# Patient Record
Sex: Male | Born: 1970 | Race: White | Hispanic: No | Marital: Single | Smoking: Current every day smoker
Health system: Southern US, Community
[De-identification: ages and names within clinical notes are randomized; demographics above are authoritative.]

## PROBLEM LIST (undated history)

## (undated) DIAGNOSIS — F329 Major depressive disorder, single episode, unspecified: Secondary | ICD-10-CM

## (undated) DIAGNOSIS — F32A Depression, unspecified: Secondary | ICD-10-CM

## (undated) DIAGNOSIS — K7460 Unspecified cirrhosis of liver: Secondary | ICD-10-CM

---

## 1998-12-17 ENCOUNTER — Inpatient Hospital Stay (HOSPITAL_COMMUNITY): Admission: EM | Admit: 1998-12-17 | Discharge: 1998-12-21 | Payer: Self-pay | Admitting: Emergency Medicine

## 2002-02-04 ENCOUNTER — Emergency Department (HOSPITAL_COMMUNITY): Admission: EM | Admit: 2002-02-04 | Discharge: 2002-02-04 | Payer: Self-pay | Admitting: Emergency Medicine

## 2002-02-05 ENCOUNTER — Inpatient Hospital Stay (HOSPITAL_COMMUNITY): Admission: EM | Admit: 2002-02-05 | Discharge: 2002-02-10 | Payer: Self-pay | Admitting: *Deleted

## 2002-02-12 ENCOUNTER — Other Ambulatory Visit (HOSPITAL_COMMUNITY): Admission: RE | Admit: 2002-02-12 | Discharge: 2002-03-13 | Payer: Self-pay | Admitting: *Deleted

## 2006-04-08 ENCOUNTER — Emergency Department (HOSPITAL_COMMUNITY): Admission: EM | Admit: 2006-04-08 | Discharge: 2006-04-09 | Payer: Self-pay | Admitting: Emergency Medicine

## 2009-04-20 ENCOUNTER — Encounter (INDEPENDENT_AMBULATORY_CARE_PROVIDER_SITE_OTHER): Payer: Self-pay | Admitting: *Deleted

## 2009-04-20 ENCOUNTER — Inpatient Hospital Stay (HOSPITAL_COMMUNITY): Admission: EM | Admit: 2009-04-20 | Discharge: 2009-04-27 | Payer: Self-pay | Admitting: Emergency Medicine

## 2009-04-20 ENCOUNTER — Encounter (INDEPENDENT_AMBULATORY_CARE_PROVIDER_SITE_OTHER): Payer: Self-pay | Admitting: Internal Medicine

## 2009-04-23 ENCOUNTER — Ambulatory Visit: Payer: Self-pay | Admitting: Gastroenterology

## 2009-04-25 ENCOUNTER — Encounter: Payer: Self-pay | Admitting: Internal Medicine

## 2009-04-26 ENCOUNTER — Encounter (INDEPENDENT_AMBULATORY_CARE_PROVIDER_SITE_OTHER): Payer: Self-pay | Admitting: *Deleted

## 2009-04-28 ENCOUNTER — Telehealth (INDEPENDENT_AMBULATORY_CARE_PROVIDER_SITE_OTHER): Payer: Self-pay

## 2009-05-04 ENCOUNTER — Telehealth: Payer: Self-pay | Admitting: Gastroenterology

## 2009-05-04 ENCOUNTER — Encounter: Payer: Self-pay | Admitting: Physician Assistant

## 2009-05-04 ENCOUNTER — Ambulatory Visit: Payer: Self-pay | Admitting: Internal Medicine

## 2009-05-04 DIAGNOSIS — I8500 Esophageal varices without bleeding: Secondary | ICD-10-CM | POA: Insufficient documentation

## 2009-05-04 DIAGNOSIS — F411 Generalized anxiety disorder: Secondary | ICD-10-CM | POA: Insufficient documentation

## 2009-05-04 DIAGNOSIS — F329 Major depressive disorder, single episode, unspecified: Secondary | ICD-10-CM

## 2009-05-04 DIAGNOSIS — D649 Anemia, unspecified: Secondary | ICD-10-CM | POA: Insufficient documentation

## 2009-05-04 DIAGNOSIS — D696 Thrombocytopenia, unspecified: Secondary | ICD-10-CM

## 2009-05-04 DIAGNOSIS — F1020 Alcohol dependence, uncomplicated: Secondary | ICD-10-CM | POA: Insufficient documentation

## 2009-05-04 DIAGNOSIS — K7460 Unspecified cirrhosis of liver: Secondary | ICD-10-CM | POA: Insufficient documentation

## 2009-05-05 ENCOUNTER — Telehealth: Payer: Self-pay | Admitting: Physician Assistant

## 2009-05-05 LAB — CONVERTED CEMR LAB
ALT: 31 units/L (ref 0–53)
AST: 58 units/L — ABNORMAL HIGH (ref 0–37)
Albumin: 2.7 g/dL — ABNORMAL LOW (ref 3.5–5.2)
Alkaline Phosphatase: 109 units/L (ref 39–117)
Hemoglobin: 8.8 g/dL — ABNORMAL LOW (ref 13.0–17.0)
Lymphs Abs: 1.6 10*3/uL (ref 0.7–4.0)
MCV: 84.5 fL (ref 78.0–100.0)
Monocytes Absolute: 0.6 10*3/uL (ref 0.1–1.0)
Monocytes Relative: 16 % — ABNORMAL HIGH (ref 3–12)
Neutro Abs: 1.2 10*3/uL — ABNORMAL LOW (ref 1.7–7.7)
Neutrophils Relative %: 33 % — ABNORMAL LOW (ref 43–77)
Potassium: 3.9 meq/L (ref 3.5–5.3)
Prothrombin Time: 22.5 s — ABNORMAL HIGH (ref 11.6–15.2)
RBC: 3.42 M/uL — ABNORMAL LOW (ref 4.22–5.81)
Sodium: 135 meq/L (ref 135–145)
Total Protein: 7 g/dL (ref 6.0–8.3)
WBC: 3.7 10*3/uL — ABNORMAL LOW (ref 4.0–10.5)

## 2009-05-19 ENCOUNTER — Ambulatory Visit: Payer: Self-pay | Admitting: Gastroenterology

## 2009-05-19 DIAGNOSIS — K7030 Alcoholic cirrhosis of liver without ascites: Secondary | ICD-10-CM

## 2009-05-19 DIAGNOSIS — R188 Other ascites: Secondary | ICD-10-CM

## 2009-07-04 ENCOUNTER — Ambulatory Visit: Payer: Self-pay | Admitting: Gastroenterology

## 2009-07-04 LAB — CONVERTED CEMR LAB
Alkaline Phosphatase: 142 units/L — ABNORMAL HIGH (ref 39–117)
Basophils Absolute: 0 10*3/uL (ref 0.0–0.1)
Basophils Relative: 1.2 % (ref 0.0–3.0)
CO2: 30 meq/L (ref 19–32)
Creatinine, Ser: 0.8 mg/dL (ref 0.4–1.5)
Eosinophils Relative: 6.3 % — ABNORMAL HIGH (ref 0.0–5.0)
GFR calc non Af Amer: 115.52 mL/min (ref >60)
Glucose, Bld: 78 mg/dL (ref 70–99)
Hemoglobin: 9.8 g/dL — ABNORMAL LOW (ref 13.0–17.0)
INR: 1.7 — ABNORMAL HIGH (ref 0.8–1.0)
Lymphocytes Relative: 38 % (ref 12.0–46.0)
Monocytes Relative: 9.3 % (ref 3.0–12.0)
Neutro Abs: 1.8 10*3/uL (ref 1.4–7.7)
Neutrophils Relative %: 45.2 % (ref 43.0–77.0)
Prothrombin Time: 17.5 s — ABNORMAL HIGH (ref 9.1–11.7)
RBC: 3.51 M/uL — ABNORMAL LOW (ref 4.22–5.81)
Sodium: 142 meq/L (ref 135–145)
Total Bilirubin: 2.7 mg/dL — ABNORMAL HIGH (ref 0.3–1.2)
WBC: 3.9 10*3/uL — ABNORMAL LOW (ref 4.5–10.5)

## 2009-07-27 ENCOUNTER — Telehealth (INDEPENDENT_AMBULATORY_CARE_PROVIDER_SITE_OTHER): Payer: Self-pay | Admitting: *Deleted

## 2009-08-23 ENCOUNTER — Ambulatory Visit: Payer: Self-pay | Admitting: Gastroenterology

## 2009-08-27 ENCOUNTER — Encounter: Payer: Self-pay | Admitting: Physician Assistant

## 2009-08-28 ENCOUNTER — Telehealth (INDEPENDENT_AMBULATORY_CARE_PROVIDER_SITE_OTHER): Payer: Self-pay | Admitting: *Deleted

## 2009-10-04 ENCOUNTER — Ambulatory Visit: Payer: Self-pay | Admitting: Physician Assistant

## 2009-10-04 DIAGNOSIS — N62 Hypertrophy of breast: Secondary | ICD-10-CM

## 2009-10-04 DIAGNOSIS — K649 Unspecified hemorrhoids: Secondary | ICD-10-CM | POA: Insufficient documentation

## 2009-10-05 LAB — CONVERTED CEMR LAB
ALT: 30 units/L (ref 0–53)
AST: 47 units/L — ABNORMAL HIGH (ref 0–37)
Albumin: 3.4 g/dL — ABNORMAL LOW (ref 3.5–5.2)
Basophils Absolute: 0 10*3/uL (ref 0.0–0.1)
Basophils Relative: 1 % (ref 0–1)
Calcium: 8.3 mg/dL — ABNORMAL LOW (ref 8.4–10.5)
Chloride: 107 meq/L (ref 96–112)
Eosinophils Absolute: 0.2 10*3/uL (ref 0.0–0.7)
Eosinophils Relative: 4 % (ref 0–5)
Lymphocytes Relative: 50 % — ABNORMAL HIGH (ref 12–46)
Lymphs Abs: 2.2 10*3/uL (ref 0.7–4.0)
MCHC: 32.2 g/dL (ref 30.0–36.0)
MCV: 85.3 fL (ref 78.0–100.0)
Monocytes Absolute: 0.5 10*3/uL (ref 0.1–1.0)
Monocytes Relative: 11 % (ref 3–12)
Neutrophils Relative %: 34 % — ABNORMAL LOW (ref 43–77)
Platelets: 91 10*3/uL — ABNORMAL LOW (ref 150–400)
Potassium: 4.1 meq/L (ref 3.5–5.3)
Sodium: 139 meq/L (ref 135–145)
Total Protein: 6.3 g/dL (ref 6.0–8.3)
WBC: 4.5 10*3/uL (ref 4.0–10.5)

## 2009-10-06 ENCOUNTER — Encounter: Payer: Self-pay | Admitting: Physician Assistant

## 2009-10-11 ENCOUNTER — Encounter: Payer: Self-pay | Admitting: Physician Assistant

## 2009-10-11 ENCOUNTER — Ambulatory Visit: Payer: Self-pay | Admitting: Family Medicine

## 2009-10-17 ENCOUNTER — Ambulatory Visit: Payer: Self-pay | Admitting: Physician Assistant

## 2009-10-17 ENCOUNTER — Telehealth: Payer: Self-pay | Admitting: Physician Assistant

## 2009-10-18 DIAGNOSIS — L538 Other specified erythematous conditions: Secondary | ICD-10-CM | POA: Insufficient documentation

## 2009-11-07 ENCOUNTER — Ambulatory Visit: Payer: Self-pay | Admitting: Physician Assistant

## 2009-11-08 ENCOUNTER — Ambulatory Visit: Payer: Self-pay | Admitting: Physician Assistant

## 2009-11-11 ENCOUNTER — Encounter: Payer: Self-pay | Admitting: Physician Assistant

## 2009-11-14 LAB — CONVERTED CEMR LAB
Basophils Relative: 1 % (ref 0–1)
Eosinophils Relative: 4 % (ref 0–5)
HCT: 35.1 % — ABNORMAL LOW (ref 39.0–52.0)
Hemoglobin: 11.2 g/dL — ABNORMAL LOW (ref 13.0–17.0)
MCHC: 31.9 g/dL (ref 30.0–36.0)
Monocytes Absolute: 0.5 10*3/uL (ref 0.1–1.0)
Monocytes Relative: 10 % (ref 3–12)
Neutro Abs: 1.7 10*3/uL (ref 1.7–7.7)
RBC: 4.12 M/uL — ABNORMAL LOW (ref 4.22–5.81)
RDW: 19.1 % — ABNORMAL HIGH (ref 11.5–15.5)

## 2009-11-17 ENCOUNTER — Ambulatory Visit: Payer: Self-pay | Admitting: Physician Assistant

## 2009-11-23 ENCOUNTER — Ambulatory Visit: Payer: Self-pay | Admitting: Gastroenterology

## 2009-11-23 DIAGNOSIS — K6289 Other specified diseases of anus and rectum: Secondary | ICD-10-CM

## 2009-11-23 DIAGNOSIS — K921 Melena: Secondary | ICD-10-CM

## 2010-03-02 ENCOUNTER — Ambulatory Visit: Payer: Self-pay | Admitting: Internal Medicine

## 2010-03-02 ENCOUNTER — Encounter (INDEPENDENT_AMBULATORY_CARE_PROVIDER_SITE_OTHER): Payer: Self-pay | Admitting: Internal Medicine

## 2010-03-02 LAB — CONVERTED CEMR LAB
Chloride: 107 meq/L (ref 96–112)
Sodium: 141 meq/L (ref 135–145)

## 2010-03-06 ENCOUNTER — Encounter (INDEPENDENT_AMBULATORY_CARE_PROVIDER_SITE_OTHER): Payer: Self-pay | Admitting: Internal Medicine

## 2010-03-07 ENCOUNTER — Encounter: Payer: Self-pay | Admitting: Gastroenterology

## 2010-04-23 LAB — CONVERTED CEMR LAB: Iron: 57 ug/dL (ref 42–165)

## 2010-04-25 NOTE — Assessment & Plan Note (Signed)
Summary: XFU-ASCITIES//DS   Vital Signs:  Patient profile:   40 year old male Height:      73 inches Weight:      201 pounds BMI:     26.61 Temp:     98.0 degrees F oral Pulse rate:   92 / minute Pulse rhythm:   regular Resp:     18 per minute BP sitting:   104 / 66  (left arm)  Vitals Entered By: Armenia Shannon (May 04, 2009 9:18 AM) CC: xf/u.... Is Patient Diabetic? No Pain Assessment Patient in pain? no       Does patient need assistance? Functional Status Self care Ambulation Normal   CC:  xf/u.....  History of Present Illness: 40 year old male presents as a new patient.  He is here for posthospitalization followup.  He has a history of alcoholism.  He was just admitted to [facility] with newly diagnosed alcoholic hepatic cirrhosis.  He developed ascites.  He underwent paracentesis removing 6.2 L of fluid.  He also underwent endoscopy that demonstrated grade 2 esophageal varices.  He was noted to have significant anemia that required transfusion with packed blood cells x2.  It was not felt that he was bleeding from his varices.  He was also noted have coagulopathy with elevated INR as well as thrombocytopenia.  He was placed on lactulose from mild encephalopathy.  He left the hospital at 214.5 pounds.  He weighs 201 pounds today.  He notes he continues to lose weight.  He is feeling better.  He denies vomiting.  He is having up to 3 stools per day.  He is thinking clearly.  He has not had any memory problems.  He is depressed.  He does not feel like doing much.  However, he does not really describe any significant lethargy.  He is taking his medications.  He sees the gastroenterologist and a couple of weeks.  Of note, with his depression, he has taken several other medications in the past.  He feels that he is depressed and he should be back on medication at this point in time.  He denies any suicidal ideations.  His PHQ9 score today is 12.  Habits &  Providers  Alcohol-Tobacco-Diet     Alcohol drinks/day: recovering alcoholic     Tobacco Status: current     Cigarette Packs/Day: <0.25     Pack years: 20  Exercise-Depression-Behavior     STD Risk: never     Drug Use: yes  Current Medications (verified): 1)  Furosemide 40 Mg Tabs (Furosemide) .... One Tab By Mouth Once Daily 2)  Potassium Chloride 20 Meq Pack (Potassium Chloride) .... One Tab By Mouth Daily While On Lasix 3)  Propranolol Hcl 10 Mg Tabs (Propranolol Hcl) .... One Tab By Mouth Three Times A Day 4)  Spironolactone 50 Mg Tabs (Spironolactone) .... One Tab By Mouth Daily 5)  Thiamine Hcl 100 Mg Tabs (Thiamine Hcl) .... One Tab Daily 6)  Cavan-Folate Ob 65-1 Mg Tabs (Prenatal Vit-Fe Fumarate-Fa) .... One Tab By Mouth Daily 7)  Kristalose 20 Gm Pack (Lactulose) .... One Tab By Mouth Daily 8)  Diphenhydramine Hcl 25 Mg Caps (Diphenhydramine Hcl) .... One Tab By Mouth Daily As Needed For Swelling 9)  Klonopin 1 Mg Tabs (Clonazepam) .... One Tab By Mouth Bi-Weekly As Needed 10)  Protonix 40 Mg Tbec (Pantoprazole Sodium) .... One Tab By Mouth Daily 11)  Percocet 5-325 Mg Tabs (Oxycodone-Acetaminophen) .... One Tab By Mouth Bi-Weekly Prn  Allergies (verified): No Known Drug Allergies  Past History:  Past Medical History: Cirrhosis   a.  alcoholic cirrhosis    b.  admx with ascites 03/2009 (s/p paracentesis with 6.2 L removed)   c.  Grade 2 esophageal varices Anemia-NOS   a.  s/p 2 units PRBCs during 03/2009 admxn   b.  no variceal bleeding detected Coagulopathy 2/2 liver cirrhosis h/o mild hepatic encephalopathy Thrombocytopenia Alcoholism Anxiety Depression (has taken zoloft, paxil, effexor in the past) ?h/o heart murmur  Past Surgical History: Denies surgical history  Family History: DM - Grandfather  Social History: Alcoholic   a.  previously drinking on daily basis (>1/5 liquor per day)   b.  denies drinking now (no ETOH since 04/19/2009) Drug use-yes  (h/o THC, cocaine, "everything in the book")   a.  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         no orthopnea or PND, no cough  Physical Exam  General:  alert, well-developed, and well-nourished.   Head:  normocephalic and atraumatic.   Eyes:  nonicteric Mouth:  pharynx pink and moist.   Neck:  supple, no thyromegaly, and no cervical lymphadenopathy.   Lungs:  normal breath sounds, no crackles, and no wheezes.   Heart:  normal rate, regular rhythm, and no murmur.   Abdomen:  distended soft nontender no hepatomegaly noted  Neurologic:  alert & oriented X3 and cranial nerves II-XII intact.   Psych:  Oriented X3, memory intact for recent and remote, and normally interactive.     Impression & Recommendations:  Problem # 1:  PREVENTIVE HEALTH CARE (ICD-V70.0) flu and pneumovax given in hosp check tsh as pt notes cold intol  Orders: T-TSH ([id number]) T-Drug Screen-Urine, (single) (04540-98119) T-HIV Antibody  (Reflex) ([id number]) T-Syphilis Test (RPR) (30865-78469)  Problem # 2:  DEPRESSION (ICD-311)  will d/w GI to see what meds we can use refer to LCSW close f/u with me  His updated medication list for this problem includes:    Klonopin 1 Mg Tabs (Clonazepam) ..... One tab by mouth bi-weekly as needed  Orders: Social Work Referral (Social )  Problem # 3:  ALCOHOLISM (ICD-303.90) patient states sober refer to sub abuse counselor  Orders: Social Work Referral (Social )  Problem # 4:  CIRRHOSIS (ICD-571.5) ascites seems to be improved weight is going down mentation seems stable having up to 3 stools per day with  lactulose no overt lethargy f/u with GI as scheduled   His updated medication list for this problem includes:    Furosemide 40 Mg Tabs (Furosemide) ..... One tab by mouth once daily    Propranolol Hcl 10 Mg Tabs (Propranolol hcl) ..... One tab by mouth three times a day    Spironolactone 50 Mg Tabs (Spironolactone) ..... One tab by mouth daily    Kristalose 20 Gm Pack (Lactulose) ..... One tab by mouth daily  Orders: T-Comprehensive Metabolic Panel [phone]) T-CBC w/Diff ([id number]) T-PT (Prothrombin Time) (66440)  Problem # 5:  ANEMIA-NOS (ICD-285.9) f/u on labs today  Orders: T-CBC w/Diff ([id number])  Problem # 6:  THROMBOCYTOPENIA (ICD-287.5) f/u on labs today  Orders: T-PT (Prothrombin Time) (87564)  Complete Medication List: 1)  Furosemide 40 Mg Tabs (Furosemide) .... One tab by mouth once daily 2)  Potassium Chloride 20 Meq Pack (Potassium chloride) .... One tab by mouth daily while on lasix 3)  Propranolol Hcl 10 Mg Tabs (Propranolol hcl) .... One tab by mouth three times a day 4)  Spironolactone 50 Mg Tabs (Spironolactone) .... One tab by mouth daily 5)  Thiamine Hcl 100 Mg Tabs (Thiamine hcl) .... One tab daily 6)  Cavan-folate Ob 65-1 Mg Tabs (Prenatal vit-fe fumarate-fa) .... One tab by mouth daily 7)  Kristalose 20 Gm Pack (Lactulose) .... One tab by mouth daily 8)  Diphenhydramine Hcl 25 Mg Caps (Diphenhydramine hcl) .... One tab by mouth daily as needed for swelling 9)  Klonopin 1 Mg Tabs (Clonazepam) .... One tab by mouth bi-weekly as needed 10)  Protonix 40 Mg Tbec (Pantoprazole sodium) .... One tab by mouth daily 11)  Percocet 5-325 Mg Tabs (Oxycodone-acetaminophen) .... One tab by mouth bi-weekly prn  Patient Instructions: 1)  Please schedule a follow-up appointment in 2 weeks with Lorin Picket to review labs and discuss depression. 2)  Follow up with Dr. Russella Dar as scheduled. 3)      [place] of Abdomen  Procedure date:  04/20/2009  Findings:       IMPRESSION:    1.  Cirrhotic appearing changes involving the liver.  No focal mass   lesions or biliary dilatation.   2.  Cholelithiasis without sonographic findings for acute   cholecystitis.  Diffuse gallbladder wall thickening is likely due   to surrounding ascites.   3.  Normal caliber common bile duct.   4.  Normal sonographic appearance of the pancreas, spleen and both   kidneys.   5.  Abdominal ascites.    Read By:  Cyndie Chime,  M.D.   Released By:  Cyndie Chime,  M.D.

## 2010-04-25 NOTE — Assessment & Plan Note (Signed)
Summary: f/u on derm////cns   Vital Signs:  Patient profile:   40 year old male Height:      73 inches Weight:      186 pounds BMI:     24.63 Temp:     97.5 degrees F oral Pulse rate:   68 / minute Pulse rhythm:   regular Resp:     18 per minute BP sitting:   91 / 59  (left arm) Cuff size:   regular  Vitals Entered By: Armenia Shannon (November 17, 2009 8:49 AM) CC: f/u on derm... Is Patient Diabetic? No Pain Assessment Patient in pain? no       Does patient need assistance? Functional Status Self care Ambulation Normal   Primary Care Onofrio Klemp:  Tereso Newcomer, [facility]  CC:  f/u on derm....  History of Present Illness: Here for f/u on GA. Saw derm and bx confirmed [place] Annulare.  Using clobetasol cream two times a day.  Lesions somewhat smaller.  No further pain.  Has been using 2 weeks. Does note occ. lightheadedness with standing.  NO syncope. No increased abd bloating.  Breathing ok.  No alcohol consumption.   No further BRBPR.  Has appt with Dr. Russella Dar next week.  No fevers.  No rectal pain.   Current Medications (verified): 1)  Furosemide 40 Mg Tabs (Furosemide) .... One Tablet By Mouth Once Daily 2)  Potassium Chloride 20 Meq Pack (Potassium Chloride) .... One Tab By Mouth Daily While On Lasix 3)  Propranolol Hcl 10 Mg Tabs (Propranolol Hcl) .... One Tab By Mouth Three Times A Day 4)  Spironolactone 50 Mg Tabs (Spironolactone) .... One Tab By Mouth Daily 5)  Thiamine Hcl 100 Mg Tabs (Thiamine Hcl) .... One Tab Daily 6)  Folic Acid 1 Mg Tabs (Folic Acid) .... Take 1 Tablet By Mouth Once A Day 7)  Kristalose 20 Gm Pack (Lactulose) .... 30 Cc By Mouth Once Daily 8)  Protonix 40 Mg Tbec (Pantoprazole Sodium) .... One Tab By Mouth Daily 9)  Zoloft 50 Mg Tabs (Sertraline Hcl) .... One Tablet By Mouth Once Daily 10)  Anusol-Hc 25 Mg Supp (Hydrocortisone Acetate) .... Apply Once Daily As Needed For Rectal Irritation or Rectal Bleeding 11)  Ferrous Sulfate 325 (65 Fe) Mg Tabs  (Ferrous Sulfate) .... Take 1 Tablet By Mouth Two Times A Day For 3 Mos, Then Decrease To Once Daily. 12)  Clobetasol Propionate 0.05 % Crea (Clobetasol Propionate) .... Apply To Lesions Two Times A Day  Allergies (verified): No Known Drug Allergies  Physical Exam  General:  alert, well-developed, and well-nourished.   Head:  normocephalic and atraumatic.   Neck:  supple.   Lungs:  normal breath sounds.   Heart:  normal rate and regular rhythm.   Abdomen:  soft, non-tender, and no hepatomegaly.  no distention.   Neurologic:  alert & oriented X3 and cranial nerves II-XII intact.   Skin:  annular plaques with some atrophic or pearly appearance on bilat hands . . . slight improvement since last visit Psych:  normally interactive and good eye contact.     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 Russella Dar  Problem # 4:  GYNECOMASTIA (ICD-611.1) as above, decrease aldactone discuss with Dr. Russella Dar if med can be changed or d/c'd  Problem # 5:  DEPRESSION (ICD-311) stable  continue counseling  His updated medication list for this problem includes:    Zoloft 50 Mg Tabs (Sertraline hcl) ..... One tablet by mouth once daily  Problem # 6:  ANEMIA-NOS (ICD-285.9) improved check labs in 2 mos  His updated medication list for this problem includes:    Folic Acid 1 Mg Tabs (Folic acid) .Marland Kitchen... Take 1 tablet by mouth once a day    Ferrous Sulfate 325 (65 Fe) Mg Tabs (Ferrous sulfate) .Marland Kitchen... Take 1 tablet by mouth two times a day for 3 mos, then decrease to once  daily.  Complete Medication List: 1)  Furosemide 40 Mg Tabs (Furosemide) .... One tablet by mouth once daily 2)  Potassium Chloride 20 Meq Pack (Potassium chloride) .... One tab by mouth daily while on lasix 3)  Propranolol Hcl 10 Mg Tabs (Propranolol hcl) .... One tab by mouth three times a day 4)  Spironolactone 50 Mg Tabs (Spironolactone) .... 1/2  tab by mouth daily 5)  Thiamine Hcl 100 Mg Tabs (Thiamine hcl) .... One tab daily 6)  Folic Acid 1 Mg Tabs (Folic acid) .... Take 1 tablet by mouth once a day 7)  Kristalose 20 Gm Pack (Lactulose) .... 30 cc by mouth once daily 8)  Protonix 40 Mg Tbec (Pantoprazole sodium) .... One tab by mouth daily 9)  Zoloft 50 Mg Tabs (Sertraline hcl) .... One tablet by mouth once daily 10)  Anusol-hc 25 Mg Supp (Hydrocortisone acetate) .... Apply once daily as needed for rectal irritation or rectal bleeding 11)  Ferrous Sulfate 325 (65 Fe) Mg Tabs (Ferrous sulfate) .... Take 1 tablet by mouth two times a day for 3 mos, then decrease to once daily. 12)  Clobetasol Propionate 0.05 % Crea (Clobetasol propionate) .... Apply to lesions two times a day  Patient Instructions: 1)  Your rash is called Granuloma Annulare.  It is an inflammation of the surface skin.  It's cause is unknown.  It is not harmful. 2)  Use the steroid cream on the lesions two times a day for at least 4 weeks.  If the lesions are getting smaller, use the cream once daily for 2 weeks, then every other day for 2 weeks, then once a week for 2 weeks, then stop. 3)  IF the lesions have not changed that much after 4 weeks, call me so we can send you back to the dermatologist. 4)  Decrease Spironolactone to 25 mg (1/2 tab of the 50 mg tablets). 5)  If your weight goes up 5 pounds in 2-3 days or you see increased abdominal bloating, go back to the 50 mg and let me know what you had to do. 6)  Return in 2 weeks for BP check and BMET with the nurse.  Notify Tanisia Yokley if BP > 140/90 or < 100/50. 7)   Please schedule a follow-up appointment in 2 months with Scott for depression and cirrhosis.    8)  Keep appointment with Dr. Russella Dar next week.

## 2010-04-25 NOTE — Letter (Signed)
Summary: PT INFORMATION SHEET  PT INFORMATION SHEET   Imported By: Arta Bruce 06/23/2009 14:51:02  _____________________________________________________________________  External Attachment:    Type:   Image     Comment:   External Document

## 2010-04-25 NOTE — Progress Notes (Signed)
  Phone Note Outgoing Call   Summary of Call: Let patient know that I spoke to Dr. Russella Dar. I want to start him on Zoloft 25 mg once daily. What pharmacy? Initial call taken by: Tereso Newcomer PA-C,  May 05, 2009 10:07 PM  Follow-up for Phone Call        Left message on answering machine for pt to call back.Marland KitchenMarland KitchenMarland KitchenArmenia Shannon  May 06, 2009 9:55 AM   spoke with pt and he is aware of meds...Marland Kitchen  pt is not sure what pharmacy will be the cheapest so he will pick it up tomorrow morning.. [place] Shannon  May 09, 2009 11:13 AM   Additional Follow-up for Phone Call Additional follow up Details #1::        Rx on your desk in folder for him to pick up. Additional Follow-up by: Tereso Newcomer PA-C,  May 09, 2009 1:23 PM    Additional Follow-up for Phone Call Additional follow up Details #2::    put in front corinding Follow-up by: [place] Shannon,  May 09, 2009 3:38 PM  New/Updated Medications: ZOLOFT 25 MG TABS (SERTRALINE HCL) Take 1 tablet by mouth once a day Prescriptions: ZOLOFT 25 MG TABS (SERTRALINE HCL) Take 1 tablet by mouth once a day  #30 x 3   Entered and Authorized by:   Tereso Newcomer PA-C   Signed by:   Tereso Newcomer PA-C on 05/09/2009   Method used:   Print then Give to Patient   RxID:   [id number]

## 2010-04-25 NOTE — Procedures (Signed)
Summary: Upper Endoscopy  Patient: John Flowers Note: All result statuses are Final unless otherwise noted.  Tests: (1) Upper Endoscopy (EGD)   EGD Upper Endoscopy       DONE     [facility]     [address]     [place], [place]  [address]           ENDOSCOPY PROCEDURE REPORT           PATIENT:  John Flowers, John Flowers  MR#:  [id number]     BIRTHDATE:  04-18-1970, 38 yrs. old  GENDER:  male           ENDOSCOPIST:  Hedwig Morton. Juanda Chance, MD     Referred by:  Venita Lick. Russella Dar, M.D., [facility]           PROCEDURE DATE:  04/25/2009     PROCEDURE:  EGD, diagnostic     ASA CLASS:  Class III     INDICATIONS:  new diagnosis of Laennec's cirrhosis, ascities,     anemia           MEDICATIONS:   Versed 6 mg, Fentanyl 75 mcg, Benadryl 12.5 mg     TOPICAL ANESTHETIC:  Cetacaine Spray           DESCRIPTION OF PROCEDURE:   After the risks benefits and     alternatives of the procedure were thoroughly explained, informed     consent was obtained.  The EG-[id number] ([id number]) endoscope was     introduced through the mouth and advanced to the second portion of     the duodenum, without limitations.  The instrument was slowly     withdrawn as the mucosa was fully examined.     <<PROCEDUREIMAGES>>           Grade II varices were found in the mid esophagus (see image001,     image002, image006, image007, and image008). no stigmata of recent     bleeding  Mild gastritis was found in the antrum (see image005 and     image004). mild antral erythema, no gastric varices  The duodenal     bulb was normal in appearance, as was the postbulbar duodenum (see     image003).    Retroflexed views revealed no abnormalities.    The     scope was then withdrawn from the patient and the procedure     completed.           COMPLICATIONS:  None           ENDOSCOPIC IMPRESSION:     1) Grade II varices in the mid esophagus     2) Mild gastritis in the antrum     3) Normal duodenum     no stigmata of bleeding     RECOMMENDATIONS:     PPI's, Beta blocker     No ASA/NSAIDs     see chart for plan of treatment           REPEAT EXAM:  In 0 year(s) for.           ______________________________     Hedwig Morton. Juanda Chance, MD           CC:           n.     eSIGNED:   Hedwig Morton. Kindall Swaby at 04/25/2009 01:03 PM           John Flowers, John Flowers, [id number]  Note: An exclamation mark (!) indicates a result that  was not dispersed into the flowsheet. Document Creation Date: 04/25/2009 1:04 PM _______________________________________________________________________  (1) Order result status: Final Collection or observation date-time: 04/25/2009 12:57 Requested date-time:  Receipt date-time:  Reported date-time:  Referring Physician:   Ordering Physician: Lina Sar [phone]) Specimen Source:  Source: Launa Grill Order Number: [id number] Lab site:

## 2010-04-25 NOTE — Progress Notes (Signed)
Summary: Office Visit//DEPRESSION SCREENING  Office Visit//DEPRESSION SCREENING   Imported By: Arta Bruce 06/30/2009 12:06:59  _____________________________________________________________________  External Attachment:    Type:   Image     Comment:   External Document

## 2010-04-25 NOTE — Letter (Signed)
Summary: New Patient letter  [facility]  [address] [place], [place] [address]   Phone: [phone]  Fax: [phone]       04/26/2009 MRN: [id number]  John Flowers [address] [place], [place]  [address]  Dear John Flowers,  Welcome to the Gastroenterology Division at [facility].    You are scheduled to see Dr.  Russella Dar on 05-19-09 at 10:15AM on the 3rd floor at [facility], [address]  We ask that you try to arrive at our office 15 minutes prior to your appointment time to allow for check-in.  We would like you to complete the enclosed self-administered evaluation form prior to your visit and bring it with you on the day of your appointment.  We will review it with you.  Also, please bring a complete list of all your medications or, if you prefer, bring the medication bottles and we will list them.  Please bring your insurance card so that we may make a copy of it.  If your insurance requires a referral to see a specialist, please bring your referral form from your primary care physician.  Co-payments are due at the time of your visit and may be paid by cash, check or credit card.     Your office visit will consist of a consult with your physician (includes a physical exam), any laboratory testing he/she may order, scheduling of any necessary diagnostic testing (e.g. x-ray, ultrasound, CT-scan), and scheduling of a procedure (e.g. Endoscopy, Colonoscopy) if required.  Please allow enough time on your schedule to allow for any/all of these possibilities.    If you cannot keep your appointment, please call [phone] to cancel or reschedule prior to your appointment date.  This allows [place] the opportunity to schedule an appointment for another patient in need of care.  If you do not cancel or reschedule by 5 p.m. the business day prior to your appointment date, you will be charged a $50.00 late cancellation/no-show fee.    Thank you for  choosing [facility] for your medical needs.  We appreciate the opportunity to care for you.  Please visit [place] at our website  to learn more about our practice.                     Sincerely,                                                             The Gastroenterology Division

## 2010-04-25 NOTE — Progress Notes (Signed)
Summary: DERMATOLOGY RESULTS//GK  Phone Note Call from Patient   Summary of Call: PT WANTS TO GET HIS RESULTS FROM THE DERMATOLOGY VISIT. PLEASE CALL HIM BACK. Kylah Maresh PA-C Initial call taken by: Manon Hilding,  October 17, 2009 12:34 PM  Follow-up for Phone Call        I looked up the path results in [place]. Biopsy consistent with Granuloma Anulare. Did the dermatologist ask for him to follow up there? Follow-up by: Tereso Newcomer PA-C,  October 18, 2009 3:56 PM  Additional Follow-up for Phone Call Additional follow up Details #1::        Left message with lady for pt to call back.Marland KitchenMarland KitchenArmenia Shannon  October 20, 2009 9:50 AM  pt says dermaltologist did not ask to f/u.... 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 Well Bx proves Granuloma Anulare. Have him take Clobetasol cream.  Apply to lesions two times a day (12 hours a part). Schedule f/u with me in 3 weeks. If no response, will have him go back to derm for injection of steroids into lesions to treat. Rx in basket to fax to his pharmacy. Follow-up by: Tereso Newcomer PA-C,  October 20, 2009 1:17 PM  Additional Follow-up for Phone Call Additional follow up Details #3:: Details for Additional Follow-up Action Taken: pt is aware and has appt scheudled.... [place] Shannon  October 20, 2009 4:39 PM   New Problems: GRANULOMA ANNULARE [id number]) New/Updated Medications: CLOBETASOL PROPIONATE 0.05 % CREA (CLOBETASOL PROPIONATE) Apply to lesions two times a day Prescriptions: CLOBETASOL PROPIONATE 0.05 % CREA (CLOBETASOL PROPIONATE) Apply to lesions two times a day  #30 grams x 2   Entered and Authorized by:   Tereso Newcomer PA-C   Signed by:   Tereso Newcomer PA-C on 10/20/2009   Method used:   Printed then faxed to ...       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 SKIN, RIGHT HAND, PUNCH BX : GRANULOMA ANNULARE    DATE REPORTED: 10/13/2009   *** Electronically Signed Out by Stahr M.D., Sharlet Salina, Dermatopathologist, Electronic Signature ***  Problem # 2:  GRANULOMA ANNULARE [id number]) no f/u planned with derm as bx proven [place], will treat with topical steroids  clobetasol 0.5% crm two times a day for 2-4 weeks f/u with me in 3 weeks if no improvement, send to derm again for poss intralesional steroids if improvement, continue vs. taper frequency until stopped  of note random sugars normal  Complete Medication List: 1)  Furosemide 40 Mg Tabs (Furosemide) .... One tablet by mouth once daily 2)  Potassium Chloride 20 Meq Pack (Potassium chloride) .... One tab by mouth daily while on lasix 3)  Propranolol Hcl 10 Mg Tabs (Propranolol hcl) .... One tab by mouth three times a day 4)  Spironolactone 50 Mg Tabs (Spironolactone) .... One tab by mouth daily 5)  Thiamine Hcl 100 Mg Tabs (Thiamine hcl) .... One tab daily 6)  Folic Acid  1 Mg Tabs (Folic acid) .... Take 1 tablet by mouth once a day 7)  Kristalose 20 Gm Pack (Lactulose) .... 30 cc by mouth once daily 8)  Protonix 40 Mg Tbec (Pantoprazole sodium) .... One tab by mouth daily 9)  Zoloft 50 Mg Tabs (Sertraline hcl) .... One tablet by mouth once daily 10)  Anusol-hc 25 Mg Supp (Hydrocortisone acetate) .... Apply once daily as needed for rectal irritation or rectal bleeding 11)  Ferrous Sulfate 325 (65 Fe) Mg Tabs (Ferrous sulfate) .... Take 1 tablet by mouth two times a day for 3 mos, then decrease to once daily. 12)  Clobetasol Propionate 0.05 % Crea (Clobetasol propionate) .... Apply to lesions two times a day   Past History:  Past Medical  History: Cirrhosis   a.  alcoholic cirrhosis    b.  admx with ascites 03/2009 (s/p paracentesis with 6.2 L removed)   c.  Grade 2 esophageal varices Anemia-NOS   a.  s/p 2 units PRBCs during 03/2009 admxn   b.  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

## 2010-04-25 NOTE — Progress Notes (Signed)
Summary: Schedule an appt wiht primary care MD  ---- Converted from flag ---- ---- 04/27/2009 6:19 PM, Meryl Dare MD [facility] wrote: Apparently this pt. has a follow up appt with me. He must have a PCP follow him as well. I will not be able to follow him exclusively. He has no insurance so he can see [facility] or [facility] interal medicine or family medicine or another PCP of his choice but his must happen for me to follow him. Please help him get that process started. I asked Maralyn Sago to arrange before discharge but not sure this happened but it may have. ------------------------------  Phone Note Outgoing Call Call back at [facility] Phone [phone]   Call placed by: Darcey Nora RN, CGRN,  April 28, 2009 2:24 PM Call placed to: Patient Summary of Call: Patient was already scheduled with [facility] and will see Dr Tereso Newcomer 05-04-09 9:15.  At the request of [facility] I have called and left a message for the patient with the appointment details .  Dr Tereso Newcomer 05-04-09 9:15 Initial call taken by: Darcey Nora RN, CGRN,  April 28, 2009 2:25 PM

## 2010-04-25 NOTE — Assessment & Plan Note (Signed)
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 He states that the rectal pain has resolved since the appt was made and he is not having any problems.  History of Present Illness:   This 40 year old male who returns today at the recommendation of Tereso Newcomer, [place] for intermittent rectal pain and rectal bleeding. He states his symptoms were more prominent. Several months ago, but over the past few months. His symptoms have all resolved. He notes no further rectal pain, but does note infrequent episodes of small amounts of bright red blood per rectum. Bowel movements abdomen normal. A possible fistula was noted on a rectal examination in July.   GI Review of Systems      Denies abdominal pain, acid reflux, belching, bloating, chest pain, dysphagia with liquids, dysphagia with solids, heartburn, loss of appetite, nausea, vomiting, vomiting blood, weight loss, and  weight gain.        Denies anal fissure, black tarry stools, change in bowel habit, constipation, diarrhea, diverticulosis, fecal incontinence, heme positive stool, hemorrhoids, irritable bowel syndrome, jaundice, light color stool, liver problems, rectal bleeding, and  rectal pain.   Current Medications (verified): 1)  Furosemide 40 Mg Tabs (Furosemide) .... One Tablet By Mouth Once Daily 2)  Potassium Chloride 20 Meq Pack (Potassium Chloride) .... One Tab By Mouth Daily While On Lasix 3)  Propranolol Hcl 10 Mg Tabs (Propranolol Hcl) .... One Tab By Mouth Three Times A Day 4)  Spironolactone 50 Mg Tabs (Spironolactone) .... 1/2  Tab By Mouth Daily 5)  Thiamine Hcl 100 Mg Tabs (Thiamine Hcl) .... One Tab Daily 6)  Folic Acid 1 Mg Tabs (Folic Acid) .... Take 1 Tablet By Mouth Once A Day 7)  Kristalose 20 Gm Pack (Lactulose) .... 30 Cc By Mouth Once  Daily 8)  Protonix 40 Mg Tbec (Pantoprazole Sodium) .... One Tab By Mouth Daily 9)  Zoloft 50 Mg Tabs (Sertraline Hcl) .... One Tablet By Mouth Once Daily 10)  Ferrous Sulfate 325 (65 Fe) Mg Tabs (Ferrous Sulfate) .... Take 1 Tablet By Mouth Two Times A Day For 3 Mos, Then Decrease To Once Daily. 11)  Clobetasol Propionate 0.05 % Crea (Clobetasol Propionate) .... Apply To Lesions Two Times A Day  Allergies (verified): No Known Drug Allergies  Past History:  Past Medical History: Reviewed history from 10/17/2009 and no changes required. Cirrhosis   a.  alcoholic cirrhosis    b.  admx with ascites 03/2009 (s/p paracentesis with 6.2 L removed)   c.  Grade 2 esophageal varices Anemia-NOS   a.  s/p 2 units PRBCs during 03/2009 admxn   b.  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 Denies surgical history  Family History: Reviewed history from 05/19/2009 and no changes required. DM - Paternal Grandfather  No FH of Colon Cancer: Family History of Heart Disease: Father Sister-Diverticulitis  Social History: Reviewed history from 05/19/2009 and no changes required. Alcoholic   a.  previously drinking on daily basis (>1/5 liquor per day)--no longer drinking   b.  denies drinking now (no ETOH since 04/19/2009) Drug use-yes (h/o THC, cocaine, "everything in the book")   a.  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 All other ROS were reviewed and were negative.   Vital Signs:  Patient profile:   40 year  old male Height:      73 inches Weight:      185.0 pounds BMI:     24.50 Pulse rate:   72 / minute Pulse rhythm:   regular BP sitting:   90 / 60  (left arm) Cuff size:   regular  Vitals Entered By: Harlow Mares CMA Duncan Dull) (November 23, 2009 11:15 AM)  Physical Exam  General:  Well developed, well nourished, no acute distress. Head:  Normocephalic and atraumatic. Mouth:  No deformity or lesions, dentition normal. Lungs:  Clear throughout to auscultation. Heart:  Regular rate and rhythm; no murmurs, rubs,  or bruits. Rectal:  Normal exam. hemocult negative.   Neurologic:  Alert and  oriented x4;  grossly normal neurologically. Psych:  Alert and cooperative. Normal mood and affect.  Impression & Recommendations:  Problem # 1:  ANAL OR RECTAL PAIN (ICD-569.42) Anal pain has resolved. Possible hemorrhoids. No evidence for fissure. Rule out other rectal lesions. I recommend proceeding with flexible sigmoidoscopy or colonoscopy, but he declined, stating his symptoms have substantially improved and he is concerned about cost. If his symptoms persist, I have strongly advised him to contact us for further evaluation.  Problem # 2:  HEMATOCHEZIA (ICD-578.1) Possible hemorrhoidal bleeding. Rule out neoplasms and other causes. As above, he declines recommended flexible sigmoidoscopy or colonoscopy at this time.  Problem # 3:  CIRRHOSIS-ALCOHOLIC (ICD-571.2) Continue alcohol abstinence. Ongoing followup with PCP.  Problem # 4:  ESOPHAGEAL VARICES WITHOUT BLEEDING (ICD-456.1) Continue propranolol 10 mg t.i.d. for variceal bleeding prophylaxis. Further followup with PCP  Problem # 5:  ASCITES (ICD-789.59) Ascites appears to be under very good control on current diuretic regimen. Further followup with PCP.  Patient Instructions: 1)  Colonoscopy and Flexible Sigmoidoscopy brochure given.  2)  Copy sent to : Tereso Newcomer, PA-C 3)  The medication list was reviewed and reconciled.  All changed /  newly prescribed medications were explained.  A complete medication list was provided to the patient / caregiver.

## 2010-04-25 NOTE — Progress Notes (Signed)
Summary: Needs follow up  Phone Note Outgoing Call   Summary of Call: I need to see him back in follow up.   I was supposed to see him a long time ago . . . no follow up.  Initial call taken by: Brynda Rim,  August 28, 2009 2:23 PM  Follow-up for Phone Call        graceila, can you schedule pt appt with scott Follow-up by: [place] Shannon,  August 29, 2009 12:11 PM  Additional Follow-up for Phone Call Additional follow up Details #1::        LEFT A MESSAGE ON PT VOICE MAIL TO CALL BACK.Manon Hilding  August 30, 2009 4:45 PM  Pt next appointment will be on September 19, 2009 at 11:30 am.Graciela Kellar  August 31, 2009 10:07 AM

## 2010-04-25 NOTE — Progress Notes (Signed)
Summary: [facility] call  Phone Note Call from Patient   Caller: [facility] Summary of Call: Urine was not sent for Urine drug screen....please follow up with late person from last night and make sure we even collected a urine sample. if not patient will have to come back into office to repeat. Thanks [facility] Initial call taken by: Mikey College CMA,  May 05, 2009 3:03 PM  Follow-up for Phone Call        tried calling pt but no answer .Marland KitchenMarland KitchenMarland KitchenArmenia Shannon  May 05, 2009 4:49 PM   Additional Follow-up for Phone Call Additional follow up Details #1::        ok to collect at follow up appt Additional Follow-up by: Brynda Rim,  May 05, 2009 10:06 PM    Additional Follow-up for Phone Call Additional follow up Details #2::    ok Follow-up by: [place] Shannon,  May 06, 2009 9:55 AM

## 2010-04-25 NOTE — Assessment & Plan Note (Signed)
Summary: F-UP CIRRHOSIS/YF   History of Present Illness Visit Type: Initial Visit Primary GI MD: Elie Goody MD [facility] Primary Provider: Tereso Newcomer, PA-C Chief Complaint: Patient here for f/u on his cirrhosis. He c/o intermittent upper abdominal burning as well as occasional bloating. He has a loss of appetite and also c/o constipatoin and occasional brbpr. History of Present Illness:   John Flowers for followup of alcoholic cirrhosis with ascites and nonbleeding varices. He has monitored his daily weights and has lost fluid weight from approximately 230 pounds during the hospitalization to 185 pounds today. He currently denies any peripheral edema. He has established ongoing medical care with [facility]. I have reviewed hospital discharge summary and hospital blood work. He relates a mild, intermittent burning upper abdominal pain that does not appear to be related to meals or any digestive function. He describes the symptoms as along both costal margins. Recent blood work performed showed a total bilirubin= 4, AST=58, albumin=2.7, hemoglobin=8.8, white blood cell count=3.7, and INR= 2.0 .   GI Review of Systems    Reports abdominal pain, bloating, loss of appetite, and  nausea.     Location of  Abdominal pain: upper abdomen.    Denies acid reflux, belching, chest pain, dysphagia with liquids, dysphagia with solids, heartburn, vomiting, vomiting blood, weight loss, and  weight gain.      Reports constipation, fecal incontinence, liver problems, rectal bleeding, and  rectal pain.     Denies anal fissure, black tarry stools, change in bowel habit, diarrhea, diverticulosis, heme positive stool, hemorrhoids, irritable bowel syndrome, jaundice, and  light color stool.   Current Medications (verified): 1)  Furosemide 40 Mg Tabs (Furosemide) .... One Tab By Mouth Once Daily 2)  Potassium Chloride 20 Meq Pack (Potassium Chloride) .... One Tab By Mouth Daily While On Lasix 3)   Propranolol Hcl 10 Mg Tabs (Propranolol Hcl) .... One Tab By Mouth Three Times A Day 4)  Spironolactone 50 Mg Tabs (Spironolactone) .... One Tab By Mouth Daily 5)  Thiamine Hcl 100 Mg Tabs (Thiamine Hcl) .... One Tab Daily 6)  Folic Acid 1 Mg Tabs (Folic Acid) .... Take 1 Tablet By Mouth Once A Day 7)  Kristalose 20 Gm Pack (Lactulose) .... 30 Cc By Mouth Once Daily 8)  Diphenhydramine Hcl 25 Mg Caps (Diphenhydramine Hcl) .... One Tab By Mouth Daily As Needed For Swelling 9)  Klonopin 1 Mg Tabs (Clonazepam) .... One Tab By Mouth Bi-Weekly As Needed 10)  Protonix 40 Mg Tbec (Pantoprazole Sodium) .... One Tab By Mouth Daily 11)  Zoloft 25 Mg Tabs (Sertraline Hcl) .... Take 1 Tablet By Mouth Once A Day  Allergies (verified): No Known Drug Allergies  Past History:  Past Medical History: Reviewed history from 05/04/2009 and no changes required. Cirrhosis   a.  alcoholic cirrhosis    b.  admx with ascites 03/2009 (s/p paracentesis with 6.2 L removed)   c.  Grade 2 esophageal varices Anemia-NOS   a.  s/p 2 units PRBCs during 03/2009 admxn   b.  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 Denies surgical history  Family History: DM - Grandfather  No FH of Colon Cancer: Family History of Heart Disease: Father Sister-Diverticulitis  Social History: Alcoholic   a.  previously drinking on daily basis (>1/5 liquor  per day)--no longer drinking   b.  denies drinking now (no ETOH since 04/19/2009) Drug use-yes (h/o THC, cocaine, "everything in the book")   a.  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         The pertinent positives and negatives are noted as above and in the HPI. All other ROS were reviewed and were negative.   Vital Signs:  Patient profile:   40 year old male Height:      73 inches Weight:      185.38 pounds BMI:     24.55 BSA:     2.09 Pulse rate:   60 / minute Pulse rhythm:   regular BP sitting:   96 / 54  (left arm)  Vitals Entered By: Hortense Ramal CMA Duncan Dull) (May 19, 2009 10:52 AM)  Physical Exam  General:  Well developed, well nourished, no acute distress. Head:  Normocephalic and atraumatic. Facial spider telangiectasias. Eyes:  PERRLA, no icterus. Mouth:  No deformity or lesions, dentition normal. Lungs:  Clear throughout to auscultation. Heart:  Regular rate and rhythm; no murmurs, rubs,  or bruits. Abdomen:  Soft and nondistended. No masses, hepatosplenomegaly or hernias noted. Normal bowel sounds. Minimal tenderness to palpation along both costal margins. No rebound or guarding. Extremities:  No peripheral edema noted Neurologic:  Alert and  oriented x4;  grossly normal neurologically. Psych:  Alert and cooperative. Normal mood and affect.  Impression & Recommendations:  Problem # 1:  CIRRHOSIS-ALCOHOLIC (ICD-571.2) He understands the importance of complete abstinence from alcohol and to continue with counseling and beginning an alcohol rehabilitation program such those offered by AA. He states he will do so.  Problem # 2:  ASCITES (ICD-789.59) He has had an excellent response to diuretics and dietary sodium restriction. Reduce furosemide to 20 mg daily. Continue spironolactone 50 mg daily. Return office visit in 6 weeks with repeat blood work.  Problem # 3:  ANEMIA-NOS (ICD-285.9) Anemia workup during his hospitalization was unremarkable. His ferritin was  48.  Problem # 4:  ESOPHAGEAL VARICES WITHOUT BLEEDING (ICD-456.1) Esophageal varices related to portal hypertension. Continue propranolol 10 mg t.i.d. for bleeding prophylaxis.  Patient Instructions: 1)  Pick up your prescription at your pharmacy. 2)  Start taking your furosemide 40mg  1/2 tablet by mouth once daily. 3)  Please schedule a follow-up appointment in 6  weeks.  4)  Copy sent to : Tereso Newcomer, PA-C 5)  The medication list was reviewed and reconciled.  All changed / newly prescribed medications were explained.  A complete medication list was provided to the patient / caregiver.  Prescriptions: ZOLOFT 50 MG TABS (SERTRALINE HCL) one tablet by mouth once daily  #30 x 1   Entered by:   Christie Nottingham CMA (AAMA)   Authorized by:   Meryl Dare MD [facility]   Signed by:   Meryl Dare MD [facility] on 05/19/2009   Method used:   Electronically to        [facility] [phone]* (retail)       [address]       [place], [place]  [address]       Ph: [phone]  Fax: [phone]   RxID:   [id number]

## 2010-04-25 NOTE — Assessment & Plan Note (Signed)
Summary: FU VISIT WITH John Votta PA-C//GK   Vital Signs:  Patient profile:   40 year old male Height:      73 inches Weight:      186 pounds BMI:     24.63 Temp:     98.1 degrees F oral Pulse rate:   63 / minute Pulse rhythm:   regular Resp:     18 per minute BP sitting:   99 / 51  (left arm) Cuff size:   regular  Vitals Entered By: [place] Shannon (October 04, 2009 2:58 PM) CC: f/u appt... pt says he has been getting bumps on his hand that will pop...Marland Kitchen pt says its only on his hands and the bumps dont itch.... Is Patient Diabetic? No Pain Assessment Patient in pain? no       Does patient need assistance? Functional Status Self care Ambulation Normal   Primary Care Provider:  Tereso Newcomer, [facility]  CC:  f/u appt... pt says he has been getting bumps on his hand that will pop...Marland Kitchen pt says its only on his hands and the bumps dont itch.....  History of Present Illness: Here for f/u.   I have not seen him since his initial visit.  Was supposed to f/u months ago.  Depression:  Started on Zoloft.  Again, never followed up with me.  Has a lot of stress at home.  Mood may be somewhat better with med.  Never saw counselor.  Seems to be more active.  No thoughts of suicide.  No alcohol since he was admitted to the hospital.  Lives at home with mom.  Not yet looking for work.  Quit smoking for a while.  Went back to smoking.  Now smoking 1/2 ppd.  No drug use.  Mom is very supportive.  Not sleeping well.  Used valerian root and melatonin.  But, stopped due to concerns with his liver.  Did not help all that much.  Used trazodone in past.  Cirrhosis:  Has occ. nausea and at other times pain.  No precipitating factors.  Only lasts a minute or 2.  No vomiting.  No abdominal swelling.  No weight gain.  Up only 4 pounds since last visit by our scales.  Casually notes bright red blood per rectum.  Notes having bloody diarrhea prior to going to hospital in Feb.  Notes occ. now.  Mainly on tissue.  Notes in  bowl occ.  Did have EGD in hosp in Feb.  To my knowledge never had colo.  States this has been going on for several mos.  No significant changes.  Also, notes nodules on bilat hands for several months.  Has some irritation around some on fingers.  Lesions somtimes resolve.    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 One Tablet By Mouth Once Daily 2)  Potassium Chloride 20 Meq Pack (Potassium Chloride) .... One Tab By Mouth Daily While On Lasix 3)  Propranolol Hcl 10 Mg Tabs (Propranolol Hcl) .... One Tab By Mouth  Three Times A Day 4)  Spironolactone 50 Mg Tabs (Spironolactone) .... One Tab By Mouth Daily 5)  Thiamine Hcl 100 Mg Tabs (Thiamine Hcl) .... One Tab Daily 6)  Folic Acid 1 Mg Tabs (Folic Acid) .... Take 1 Tablet By Mouth Once A Day 7)  Kristalose 20 Gm Pack (Lactulose) .... 30 Cc By Mouth Once Daily 8)  Klonopin 1 Mg Tabs (Clonazepam) .... One Tab By Mouth Bi-Weekly As Needed 9)  Protonix 40 Mg Tbec (Pantoprazole Sodium) .... One Tab By Mouth Daily 10)  Zoloft 50 Mg Tabs (Sertraline Hcl) .... One Tablet By Mouth Once Daily  Allergies (verified): No Known Drug Allergies  Past History:  Past Medical History: Last updated: 05/04/2009 Cirrhosis   a.  alcoholic cirrhosis    b.  admx with ascites 03/2009 (s/p paracentesis with 6.2 L removed)   c.  Grade 2 esophageal varices Anemia-NOS   a.  s/p 2 units PRBCs during 03/2009 admxn   b.  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  previously drinking on daily basis (>1/5 liquor per day)--no longer drinking   b.  denies drinking now (no ETOH since 04/19/2009) Drug use-yes (h/o THC, cocaine, "everything in the book")   a.  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   Head:  normocephalic and atraumatic.   Neck:  supple.   Breasts:  gynecomastia.   Lungs:  normal breath sounds.   Heart:  normal rate and regular rhythm.   Abdomen:  soft, non-tender, and no hepatomegaly.   Rectal:  on exam, appears to have int hem at around 12:00 there is an open area that appears to be an opening to a tract (?fistula) no gross blood noted Neurologic:  alert & oriented X3 and cranial nerves II-XII intact.   Skin:  scattered violaceous plaques on bilat hands   Impression & Recommendations:  Problem # 1:  DEPRESSION (ICD-311) minimal improvement with SSRI would not increase dose with h/o cirrhosis encouraged him to see counselor for continued treatment of depression  The following medications were removed from the medication list:    Klonopin 1 Mg Tabs (Clonazepam) ..... One tab by mouth bi-weekly as needed His updated medication list for this problem includes:    Zoloft 50 Mg Tabs (Sertraline hcl) ..... One tablet by mouth once daily  Problem # 2:  GYNECOMASTIA (ICD-611.1) probably related to spironolactone also having symptoms of change in semen suggest he discuss with Dr. Russella Dar before changing meds he can discuss with him at his next routine office visit  Problem # 3:  SKIN RASH  (ICD-782.1)  I believe this looks like Lichen Planus will have him see derm clinic to confirm  Orders: Dermatology Referral (Derma)  Problem # 4:  CIRRHOSIS-ALCOHOLIC (ICD-571.2) check labs continue current meds  Problem # 5:  THROMBOCYTOPENIA (ICD-287.5)  check labs  Orders: T-CBC w/Diff ([id number])  Problem # 6:  ANEMIA-NOS (ICD-285.9)  check labs  His updated medication list for this problem includes:    Folic Acid 1 Mg Tabs (Folic acid) .Marland Kitchen... Take 1 tablet by mouth once a day  Orders: T-CBC w/Diff ([id number])  Problem # 7:  HEMORRHOIDS (ICD-455.6) appears to have int hem also has what appears to be a fistula will give him anusol to use as needed refer him back to Dr. Russella Dar for this to evaluate  Complete Medication List: 1)  Furosemide 40 Mg Tabs (Furosemide) .... One tablet by mouth once daily 2)  Potassium Chloride 20 Meq Pack (Potassium chloride) .... One tab by mouth daily while on lasix 3)  Propranolol Hcl 10 Mg Tabs (Propranolol hcl) .... One tab by mouth three times a day 4)  Spironolactone 50 Mg Tabs (Spironolactone) .... One tab by mouth daily 5)  Thiamine Hcl 100 Mg Tabs (Thiamine hcl) .... One tab daily 6)  Folic Acid 1 Mg Tabs (Folic acid) .... Take 1 tablet by mouth once a day 7)  Kristalose 20 Gm Pack (Lactulose) .... 30 cc by mouth once daily 8)  Protonix 40 Mg Tbec (Pantoprazole sodium) .... One tab by mouth daily 9)  Zoloft 50 Mg Tabs (Sertraline hcl) .... One tablet by mouth once daily 10)  Anusol-hc 25 Mg Supp (Hydrocortisone acetate) .... Apply once daily as needed for rectal irritation or rectal bleeding  Other Orders: T-Comprehensive Metabolic Panel ([id number])  Patient Instructions: 1)  Schedule appointment at the derm clinic on [facility]. for rash on your hands. 2)  Schedule appt with Ethelene Browns. 3)  I am sending you back to Dr. Russella Dar to look at your hemorrhoids. 4)  If you see bleeding or have irriation when you go to the  bathroom, you may use the anusol suppositories (only as needed). 5)  Please schedule a follow-up appointment in 2 months with Webb Weed.  6)  Try benadryl 25 mg at bedtime as needed for sleep. Prescriptions: ANUSOL-HC 25 MG SUPP (HYDROCORTISONE ACETATE) apply once daily as needed for rectal irritation or rectal bleeding  #20 x 1   Entered and Authorized by:   Tereso Newcomer PA-C   Signed by:   Tereso Newcomer PA-C on 10/04/2009   Method used:   Print then Give to Patient   RxID:   [id number]

## 2010-04-25 NOTE — Letter (Signed)
Summary: PSYCHIATRIC EVAL  PSYCHIATRIC EVAL   Imported By: Arta Bruce 11/21/2009 16:15:24  _____________________________________________________________________  External Attachment:    Type:   Image     Comment:   External Document

## 2010-04-25 NOTE — Progress Notes (Signed)
  Phone Note Other Incoming   Request: Send information Summary of Call: Request for records received from [facility]. Request forwarded to [facility].     

## 2010-04-25 NOTE — Assessment & Plan Note (Signed)
Summary: 6 WEEK F/U..AM.   History of Present Illness Visit Type: Follow-up Visit Primary GI MD: Elie Goody MD [facility] Primary Provider: Tereso Newcomer, MD Chief Complaint: Patient here for 6 week f/u cirrhosis. Patient states that he is now having some lower abdominal discomfort at times "like I've been hit in the stomach." He also c/o insomnia. History of Present Illness:   This is a return offices for alcoholic cirrhosis with ascites and peripheral edema. His weight decreased to 175 pounds on furosemide 40 mg daily and spironolactone 50 mg daily. Since decreasing his furosemide 20 mg daily, his weight has increased to 186 pounds and he has noted slight pedal edema. He relates brief upper abdominal pain that lasts only for a few seconds at a time. It is intermittent, unpredictable and not associated with meals or bowel movements.   GI Review of Systems    Reports abdominal pain and  nausea.     Location of  Abdominal pain: upper abdomen.    Denies acid reflux, belching, bloating, chest pain, dysphagia with liquids, dysphagia with solids, heartburn, loss of appetite, vomiting, vomiting blood, weight loss, and  weight gain.      Reports liver problems.     Denies anal fissure, black tarry stools, change in bowel habit, constipation, diarrhea, diverticulosis, fecal incontinence, heme positive stool, hemorrhoids, irritable bowel syndrome, jaundice, light color stool, rectal bleeding, and  rectal pain.   Current Medications (verified): 1)  Furosemide 40 Mg Tabs (Furosemide) .... 1/2 Tab By Mouth Once Daily 2)  Potassium Chloride 20 Meq Pack (Potassium Chloride) .... One Tab By Mouth Daily While On Lasix 3)  Propranolol Hcl 10 Mg Tabs (Propranolol Hcl) .... One Tab By Mouth Three Times A Day 4)  Spironolactone 50 Mg Tabs (Spironolactone) .... One Tab By Mouth Daily 5)  Thiamine Hcl 100 Mg Tabs (Thiamine Hcl) .... One Tab Daily 6)  Folic Acid 1 Mg Tabs (Folic Acid) .... Take 1 Tablet By Mouth  Once A Day 7)  Kristalose 20 Gm Pack (Lactulose) .... 30 Cc By Mouth Once Daily 8)  Klonopin 1 Mg Tabs (Clonazepam) .... One Tab By Mouth Bi-Weekly As Needed 9)  Protonix 40 Mg Tbec (Pantoprazole Sodium) .... One Tab By Mouth Daily 10)  Zoloft 50 Mg Tabs (Sertraline Hcl) .... One Tablet By Mouth Once Daily  Allergies (verified): No Known Drug Allergies  Past History:  Past Medical History: Reviewed history from 05/04/2009 and no changes required. Cirrhosis   a.  alcoholic cirrhosis    b.  admx with ascites 03/2009 (s/p paracentesis with 6.2 L removed)   c.  Grade 2 esophageal varices Anemia-NOS   a.  s/p 2 units PRBCs during 03/2009 admxn   b.  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 Denies surgical history  Family History: Reviewed history from 05/19/2009 and no changes required. DM - Grandfather  No FH of Colon Cancer: Family History of Heart Disease: Father Sister-Diverticulitis  Social History: Reviewed history from 05/19/2009 and no changes required. Alcoholic   a.  previously drinking on daily basis (>1/5 liquor per day)--no longer drinking   b.  denies drinking now (no ETOH since 04/19/2009) Drug use-yes (h/o THC, cocaine, "everything in the book")   a.  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         The pertinent positives and negatives are noted as above and in the HPI. All other ROS were reviewed and were negative.  Vital Signs:  Patient profile:   40 year old male Height:      73 inches Weight:       188.31 pounds Pulse rate:   60 / minute Pulse rhythm:   regular BP sitting:   94 / 60  (left arm)  Vitals Entered By: Hortense Ramal CMA Duncan Dull) (July 04, 2009 10:43 AM)  Physical Exam  General:  Well developed, well nourished, no acute distress. Head:  Normocephalic and atraumatic. Eyes:  PERRLA, no icterus. Mouth:  No deformity or lesions, dentition normal. Lungs:  Clear throughout to auscultation. Heart:  Regular rate and rhythm; no murmurs, rubs,  or bruits. Abdomen:  Soft, nontender and nondistended. No masses, hepatosplenomegaly or hernias noted. Normal bowel sounds. Extremities:  1+ pedal edema.   Psych:  Alert and cooperative. Normal mood and affect.  Impression & Recommendations:  Problem # 1:  ASCITES (ICD-789.59) Increase furosemide to 40 mg daily, and continue to monitor daily weights. BMET in one week. Continue a 4 g sodium diet. Orders: TLB-CMP (Comprehensive Metabolic Pnl) ([id number]) TLB-PT (Protime) (85610-PTP) TLB-CBC Platelet - w/Differential (85025-[id number])  Problem # 2:  CIRRHOSIS-ALCOHOLIC (ICD-571.2) Continue alcohol abstinence, and counseling for maintenance of abstinence long-term. Orders: TLB-CMP (Comprehensive Metabolic Pnl) ([id number]) TLB-PT (Protime) (85610-PTP) TLB-CBC Platelet - w/Differential (85025-[id number])  Problem # 3:  ESOPHAGEAL VARICES WITHOUT BLEEDING (ICD-456.1) Continue propranolol 10 mg t.i.d.   Patient Instructions: 1)  Get labs drawn today in the basement.  2)  Start taking furosemide one tablet by mouth once daily. 3)  Please schedule a follow-up appointment in 6  weeks.  4)  Copy sent to : Tereso Newcomer, PA-C 5)  The medication list was reviewed and reconciled.  All changed / newly prescribed medications were explained.  A complete medication list was provided to the patient / caregiver.  Prescriptions: FUROSEMIDE 40 MG TABS (FUROSEMIDE) one tablet by mouth once daily  #30 x 5   Entered by:   Christie Nottingham CMA (AAMA)    Authorized by:   Meryl Dare MD [facility]   Signed by:   Meryl Dare MD [facility] on 07/04/2009   Method used:   Electronically to        [facility] [phone]* (retail)       [address]       [place], [place]  [address]       Ph: [phone]       Fax: [phone]   RxID:   [id number]

## 2010-04-25 NOTE — Letter (Signed)
Summary: DERMATOLOGY  NOTES  DERMATOLOGY  NOTES   Imported By: Arta Bruce 10/20/2009 10:35:32  _____________________________________________________________________  External Attachment:    Type:   Image     Comment:   External Document

## 2010-04-25 NOTE — Progress Notes (Signed)
Summary: consult  Phone Note From Other Clinic Call back at [phone] (cell)   Caller: Patient Caller: Tereso Newcomer, PA at [facility] Call For: Dr. Russella Dar Summary of Call: has question regarding new GI diagnosis for pt as well as existing diagnois of depression... needs consultation on which drugs ok to use with consideration to depression Initial call taken by: Vallarie Mare,  May 04, 2009 10:36 AM  Follow-up for Phone Call        For cirrhosis or significant hepatic dysfunction a using a reduced dose of Zoloft is appropriate.  Follow-up by: Meryl Dare MD Clementeen Graham,  May 04, 2009 11:21 AM  Additional Follow-up for Phone Call Additional follow up Details #1::        Thanks!  Additional Follow-up by: Tereso Newcomer PA-C,  May 04, 2009 1:52 PM

## 2010-04-25 NOTE — Assessment & Plan Note (Signed)
Summary: F/U APPT...LSW.   History of Present Illness Visit Type: Follow-up Visit Primary GI MD: Elie Goody MD [facility] Primary Provider: Tereso Newcomer, [facility] Chief Complaint: folllow-up  Pt. denies any problems or complaints at this time. History of Present Illness:   This is a return office visit for cirrhosis and ascites. He has no complaints at this time. He feels his weight is near his baseline at 182 pounds, down 6 pounds since his office visit in April.    GI Review of Systems      Denies abdominal pain, acid reflux, belching, bloating, chest pain, dysphagia with liquids, dysphagia with solids, heartburn, loss of appetite, nausea, vomiting, vomiting blood, weight loss, and  weight gain.        Denies anal fissure, black tarry stools, change in bowel habit, constipation, diarrhea, diverticulosis, fecal incontinence, heme positive stool, hemorrhoids, irritable bowel syndrome, jaundice, light color stool, liver problems, rectal bleeding, and  rectal pain.   Current Medications (verified): 1)  Furosemide 40 Mg Tabs (Furosemide) .... One Tablet By Mouth Once Daily 2)  Potassium Chloride 20 Meq Pack (Potassium Chloride) .... One Tab By Mouth Daily While On Lasix 3)  Propranolol Hcl 10 Mg Tabs (Propranolol Hcl) .... One Tab By Mouth Three Times A Day 4)  Spironolactone 50 Mg Tabs (Spironolactone) .... One Tab By Mouth Daily 5)  Thiamine Hcl 100 Mg Tabs (Thiamine Hcl) .... One Tab Daily 6)  Folic Acid 1 Mg Tabs (Folic Acid) .... Take 1 Tablet By Mouth Once A Day 7)  Kristalose 20 Gm Pack (Lactulose) .... 30 Cc By Mouth Once Daily 8)  Klonopin 1 Mg Tabs (Clonazepam) .... One Tab By Mouth Bi-Weekly As Needed 9)  Protonix 40 Mg Tbec (Pantoprazole Sodium) .... One Tab By Mouth Daily 10)  Zoloft 50 Mg Tabs (Sertraline Hcl) .... One Tablet By Mouth Once Daily  Allergies (verified): No Known Drug Allergies  Past History:  Past Medical History: Reviewed history from 05/04/2009 and no  changes required. Cirrhosis   a.  alcoholic cirrhosis    b.  admx with ascites 03/2009 (s/p paracentesis with 6.2 L removed)   c.  Grade 2 esophageal varices Anemia-NOS   a.  s/p 2 units PRBCs during 03/2009 admxn   b.  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 Denies surgical history  Family History: Reviewed history from 05/19/2009 and no changes required. DM - Grandfather  No FH of Colon Cancer: Family History of Heart Disease: Father Sister-Diverticulitis  Social History: Reviewed history from 05/19/2009 and no changes required. Alcoholic   a.  previously drinking on daily basis (>1/5 liquor per day)--no longer drinking   b.  denies drinking now (no ETOH since 04/19/2009) Drug use-yes (h/o THC, cocaine, "everything in the book")   a.  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         The pertinent positives and negatives are noted as above and in the HPI. All other ROS were reviewed and were negative.   Vital Signs:  Patient profile:   40 year old  male Height:      73 inches Weight:      182 pounds BMI:     24.10 Pulse rate:   68 / minute Pulse rhythm:   regular BP sitting:   110 / 62  (left arm)  Vitals Entered By: Milford Cage NCMA (Aug 23, 2009 10:40 AM)  Physical Exam  General:  Well developed, well nourished, no acute distress. Head:  Normocephalic and atraumatic. Eyes:  PERRLA, no icterus. Mouth:  No deformity or lesions, dentition normal. Lungs:  Clear throughout to  auscultation. Heart:  Regular rate and rhythm; no murmurs, rubs,  or bruits. Abdomen:  Soft, nontender and nondistended. No masses, hepatosplenomegaly or hernias noted. Normal bowel sounds. Left lobe of the liver is palpable in the epigastrium. Extremities:  trace pedal edema.   Neurologic:  Alert and  oriented x4;  grossly normal neurologically. Psych:  Alert and cooperative. Normal mood and affect.  Impression & Recommendations:  Problem # 1:  CIRRHOSIS-ALCOHOLIC (ICD-571.2) Continue alcohol abstinence. Since his liver disease, and ascites are stable, I am releasing into ongoing followup with Tereso Newcomer PAC. I suggest followup about every 2 months to monitor diuretic therapy with his PCP. His diuretics may need to be adjusted over the course of time.  Problem # 2:  ASCITES (ICD-789.59) Ascites under very good control. Only trace pedal edema, and weight close to baseline. Continue current diuretic regimen.   Problem # 3:  ESOPHAGEAL VARICES WITHOUT BLEEDING (ICD-456.1) Continue propranolol t.i.d. for variceal bleeding prophylaxis.   Problem # 4:  ALCOHOLISM (ICD-303.90) As above.  Patient Instructions: 1)  Please continue current medications.  2)  Please schedule a follow-up appointment in 6 months. 3)  Copy sent to : Tereso Newcomer, PAC 4)  The medication list was reviewed and reconciled.  All changed / newly prescribed medications were explained.  A complete medication list was provided to the patient / caregiver.

## 2010-04-27 NOTE — Letter (Signed)
Summary: *HSN Results Follow up  [facility]  [address] [place], [place] [address]   Phone: [phone]  Fax: [phone]      03/06/2010   BHARGAV BARBARO [place] [address] [place], [place]  [address]   Dear  Mr. GALEN RUSSMAN,                            ____S.Drinkard,FNP   ____D. Gore,FNP       ____B. McPherson,MD   ____V. Rankins,MD    ___X_E. Mulberry,MD    ____N. Daphine Deutscher, FNP  ____D. Reche Dixon, MD    ____K. Philipp Deputy, MD    ____Other     This letter is to inform you that your recent test(s):  _______Pap Smear    ___X____Lab Test     _______X-ray    ___X____ is within acceptable limits  _______ requires a medication change  _______ requires a follow-up lab visit  _______ requires a follow-up visit with your provider   Comments:  potassium and kidney function okay on current dose of Spironolactone--do not change dosing.       _________________________________________________________ If you have any questions, please contact our office                     Sincerely,  Julieanne Manson MD [facility]

## 2010-04-27 NOTE — Letter (Signed)
Summary: Office Visit Letter  [facility]  [address] [place], [place] [address]   Phone: [phone]  Fax: [phone]      March 07, 2010 MRN: [id number]   John Flowers [address] [place], [place]  [address]   Dear John Flowers,   According to our records, it is time for you to schedule a follow-up office visit with [place].   At your convenience, please call [phone] (option #2)to schedule an office visit. If you have any questions, concerns, or feel that this letter is in error, we would appreciate your call.   Sincerely,    Judie Petit T. Russella Dar, M.D.  [facility] [phone]

## 2010-04-27 NOTE — Assessment & Plan Note (Signed)
Summary: MEDS REFILL,F/U APP//MC   Vital Signs:  Patient profile:   40 year old male Weight:      190.38 pounds BMI:     25.21 Temp:     97.6 degrees F oral Pulse rate:   80 / minute Pulse rhythm:   regular Resp:     14 per minute BP sitting:   108 / 68  (left arm) Cuff size:   regular  Vitals Entered By: Hale Drone CMA (March 02, 2010 11:13 AM) CC: 2 month f/u on depression and cirrhosis. Needs refills on all meds.  Is Patient Diabetic? No Pain Assessment Patient in pain? no       Does patient need assistance? Functional Status Self care Ambulation Normal   Primary Care Provider:  Tereso Newcomer, [facility]  CC:  2 month f/u on depression and cirrhosis. Needs refills on all meds. .  History of Present Illness: 1.  Low BP with orthostasis at last visit:  was to halve his spironolactone, but did not occur.  Did not follow up with bp check or lab as well.  No problems with above symptoms now.  2.  Granuloma annularea:  improved somewhat with Clobetasol.  Only involvement on hands.  3.  Depression: Was getting counseling with Aquilla Solian.  Continues on Zoloft.  Sleeping is better after ETOH for about 1 year.  Often times anxious.  Not really excited about life, but not negative as well.    4.  Hepatic Cirrhosis/hx of ascites, esophageal varices.  Has not been immunized against Hep A and B that he is aware.    Current Medications (verified): 1)  Furosemide 40 Mg Tabs (Furosemide) .... One Tablet By Mouth Once Daily 2)  Potassium Chloride 20 Meq Pack (Potassium Chloride) .... One Tab By Mouth Daily While On Lasix 3)  Propranolol Hcl 10 Mg Tabs (Propranolol Hcl) .... One Tab By Mouth Three Times A Day 4)  Spironolactone 50 Mg Tabs (Spironolactone) .... 1/2  Tab By Mouth Daily 5)  Thiamine Hcl 100 Mg Tabs (Thiamine Hcl) .... One Tab Daily 6)  Folic Acid 1 Mg Tabs (Folic Acid) .... Take 1 Tablet By Mouth Once A Day 7)  Kristalose 20 Gm Pack (Lactulose) .... 30 Cc By Mouth Once  Daily 8)  Protonix 40 Mg Tbec (Pantoprazole Sodium) .... One Tab By Mouth Daily 9)  Zoloft 50 Mg Tabs (Sertraline Hcl) .... One Tablet By Mouth Once Daily 10)  Ferrous Sulfate 325 (65 Fe) Mg Tabs (Ferrous Sulfate) .... Take 1 Tablet By Mouth Two Times A Day For 3 Mos, Then Decrease To Once Daily. 11)  Clobetasol Propionate 0.05 % Crea (Clobetasol Propionate) .... Apply To Lesions Two Times A Day  Allergies (verified): No Known Drug Allergies  Physical Exam  General:  NAD Lungs:  Normal respiratory effort, chest expands symmetrically. Lungs are clear to auscultation, no crackles or wheezes. Heart:  Normal rate and regular rhythm. S1 and S2 normal without gallop, murmur, click, rub or other extra sounds. Abdomen:  Bowel sounds positive,abdomen soft and non-tender without masses, organomegaly or hernias noted. Extremities:  No edema Skin:  Involvement at MCPs, along sides of fingers and a couple areas on dorsum of proximal hands.   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 2 tabs by mouth daily  Complete Medication List: 1)  Furosemide 40 Mg Tabs (Furosemide) .... One tablet by mouth once daily 2)  Potassium Chloride 20 Meq Pack (Potassium chloride) .... One tab by mouth daily while on lasix 3)  Propranolol Hcl 10 Mg Tabs (Propranolol hcl) .... One tab by mouth three times a day 4)  Spironolactone 50 Mg Tabs (Spironolactone) .Marland Kitchen.. 1  tab by mouth  daily 5)  Thiamine Hcl 100 Mg Tabs (Thiamine hcl) .... One tab daily 6)  Folic Acid 1 Mg Tabs (Folic acid) .... Take 1 tablet by mouth once a day 7)  Kristalose 20 Gm Pack (Lactulose) .... 30 cc by mouth once daily 8)  Protonix 40 Mg Tbec (Pantoprazole sodium) .... One tab by mouth daily 9)  Zoloft 50 Mg Tabs (Sertraline hcl) .... 2 tabs by mouth daily 10)  Ferrous Sulfate 325 (65 Fe) Mg Tabs (Ferrous sulfate) .... Take 1 tablet by mouth two times a day for 3 mos, then decrease to once daily. 11)  Clobetasol Propionate 0.05 % Crea (Clobetasol propionate) .... Apply to lesions two times a day  Other Orders: Flu Vaccine 5yrs + (09811) Admin 1st Vaccine (91478) Tdap => 64yrs IM (29562) Admin of Any Addtl Vaccine (13086)  Patient Instructions: 1)  Follow up with Dr. Delrae Alfred in 2- 3 months for depression Prescriptions: ZOLOFT 50 MG TABS (SERTRALINE HCL) 2 tabs by mouth daily  #60 x 11   Entered and Authorized by:   Julieanne Manson MD   Signed by:   Julieanne Manson MD on 03/02/2010   Method used:   Faxed to ...       [facility] - [facility] (retail)       [address] [place], [place]  [address]       Ph: [phone]       Fax: [phone]   RxID:   [id number] CLOBETASOL PROPIONATE 0.05 % CREA (CLOBETASOL PROPIONATE) Apply to lesions two times a day  #30 grams x 2   Entered and Authorized by:   Julieanne Manson MD   Signed by:   Julieanne Manson MD on 03/02/2010   Method used:   Faxed to ...       [facility] - [facility] (retail)       [address] [place], [place]  [address]       Ph: [phone]       Fax: [phone]   RxID:   [id number] ZOLOFT 50 MG TABS (SERTRALINE HCL) one tablet by mouth once daily  #30 x 11   Entered and Authorized by:   Julieanne Manson MD   Signed by:   Julieanne Manson MD on 03/02/2010   Method used:   Faxed to ...       [facility]  - Pharmac (retail)       [address] [place], [place]  [address]       Ph: [phone]       Fax: [phone]   RxID:   [id number] PROTONIX 40 MG TBEC (PANTOPRAZOLE SODIUM) one tab  by mouth daily  #30 x 11   Entered and Authorized by:   Julieanne Manson MD   Signed by:   Julieanne Manson MD on 03/02/2010   Method used:   Faxed to ...       [facility] - Pharmac (retail)       [address] [place], [place]  [address]       Ph: [phone]       Fax: [phone]   RxID:   [id number] PROPRANOLOL HCL 10 MG TABS (PROPRANOLOL HCL) one tab by mouth three times a day  #90 x 11   Entered and Authorized by:   Julieanne Manson MD   Signed by:   Julieanne Manson MD on 03/02/2010   Method used:   Faxed to ...       [facility] - Pharmac (retail)       [address] [place], [place]  [address]       Ph: [phone]       Fax: [phone]   RxID:   [id number] POTASSIUM CHLORIDE 20 MEQ PACK (POTASSIUM CHLORIDE) one tab by mouth daily while on lasix  #30 x 11   Entered and Authorized by:   Julieanne Manson MD   Signed by:   Julieanne Manson MD on 03/02/2010   Method used:   Faxed to ...       [facility] - Pharmac (retail)       [address] [place], [place]  [address]       Ph: [phone]       Fax: [phone]   RxID:   [id number] FUROSEMIDE 40 MG TABS (FUROSEMIDE) one tablet by mouth once daily  #30 x 11   Entered and Authorized by:   Julieanne Manson MD   Signed by:   Julieanne Manson MD on 03/02/2010   Method used:   Faxed to ...       [facility] - Pharmac (retail)       [address] [place], [place]  [address]       Ph: [phone]       Fax: [phone]   RxID:   [id number] KRISTALOSE 20 GM PACK (LACTULOSE) 30 cc by mouth once daily  #1 month x 11   Entered and  Authorized by:   Julieanne Manson MD   Signed by:   Julieanne Manson MD on 03/02/2010   Method used:   Electronically to        [facility] [phone] (retail)       [address]       [place], [place]  [address]       Ph: [phone]       Fax: [phone]   RxID:   [id number] SPIRONOLACTONE 50 MG TABS (SPIRONOLACTONE) 1  tab by mouth daily  #30 x 11   Entered and Authorized by:   Julieanne Manson MD   Signed by:   Julieanne Manson MD on 03/02/2010   Method used:   Faxed to ...       [facility] - Pharmac (retail)       [address] [place], [place]  [address]       Ph: [phone]       Fax: [phone]  RxID:   [id number]    Orders Added: 1)  Flu Vaccine 32yrs + [14782] 2)  Admin 1st Vaccine [90471] 3)  T-Basic Metabolic Panel [95621-30865] 4)  Est. Patient Level IV [78469] 5)  Tdap => 75yrs IM [90715] 6)  Admin of Any Addtl Vaccine [62952]   Immunizations Administered:  Influenza Vaccine # 1:    Vaccine Type: Fluvax 3+    Site: left deltoid    Mfr: GlaxoSmithKline    Dose: 0.5 ml    Route: IM    Given by: Hale Drone CMA    Exp. Date: 09/23/2010    Lot #: [id number]    VIS given: 10/18/09 version given March 02, 2010.  Tetanus Vaccine:    Vaccine Type: Tdap    Site: left deltoid    Mfr: Sanofi Pasteur    Dose: 0.5 ml    Route: IM    Given by: Hale Drone CMA    Exp. Date: 06/22/2012    Lot #: [id number]    VIS given: 02/11/08 version given March 02, 2010.  Flu Vaccine Consent Questions:    Do you have a history of severe allergic reactions to this vaccine? no    Any prior history of allergic reactions to egg and/or gelatin? no    Do you have a sensitivity to the preservative Thimersol? no    Do you have a past history of Guillan-Barre Syndrome? no    Do you currently have an acute febrile illness? no    Have you ever had a severe reaction to latex? no    Vaccine information given and  explained to patient? yes   Immunizations Administered:  Influenza Vaccine # 1:    Vaccine Type: Fluvax 3+    Site: left deltoid    Mfr: GlaxoSmithKline    Dose: 0.5 ml    Route: IM    Given by: Hale Drone CMA    Exp. Date: 09/23/2010    Lot #: [id number]    VIS given: 10/18/09 version given March 02, 2010.  Tetanus Vaccine:    Vaccine Type: Tdap    Site: left deltoid    Mfr: Sanofi Pasteur    Dose: 0.5 ml    Route: IM    Given by: Hale Drone CMA    Exp. Date: 06/22/2012    Lot #: [id number]    VIS given: 02/11/08 version given March 02, 2010.

## 2010-06-11 LAB — BASIC METABOLIC PANEL
BUN: 2 mg/dL — ABNORMAL LOW (ref 6–23)
BUN: 4 mg/dL — ABNORMAL LOW (ref 6–23)
BUN: 5 mg/dL — ABNORMAL LOW (ref 6–23)
CO2: 25 mEq/L (ref 19–32)
CO2: 25 mEq/L (ref 19–32)
Calcium: 7.4 mg/dL — ABNORMAL LOW (ref 8.4–10.5)
Calcium: 7.7 mg/dL — ABNORMAL LOW (ref 8.4–10.5)
Calcium: 8.3 mg/dL — ABNORMAL LOW (ref 8.4–10.5)
Chloride: 103 mEq/L (ref 96–112)
Creatinine, Ser: 0.71 mg/dL (ref 0.4–1.5)
Creatinine, Ser: 0.84 mg/dL (ref 0.4–1.5)
GFR calc Af Amer: >60 mL/min (ref >60)
GFR calc Af Amer: >60 mL/min (ref >60)
GFR calc non Af Amer: >60 mL/min (ref >60)
GFR calc non Af Amer: >60 mL/min (ref >60)
GFR calc non Af Amer: >60 mL/min (ref >60)
Glucose, Bld: 112 mg/dL — ABNORMAL HIGH (ref 70–99)
Glucose, Bld: 89 mg/dL (ref 70–99)
Potassium: 3.4 mEq/L — ABNORMAL LOW (ref 3.5–5.1)
Potassium: 3.9 mEq/L (ref 3.5–5.1)
Sodium: 133 mEq/L — ABNORMAL LOW (ref 135–145)
Sodium: 135 mEq/L (ref 135–145)

## 2010-06-11 LAB — DIFFERENTIAL
Band Neutrophils: 0 % (ref 0–10)
Band Neutrophils: 3 % (ref 0–10)
Basophils Absolute: 0 10*3/uL (ref 0.0–0.1)
Basophils Absolute: 0 10*3/uL (ref 0.0–0.1)
Basophils Relative: 0 % (ref 0–1)
Basophils Relative: 0 % (ref 0–1)
Eosinophils Absolute: 0 10*3/uL (ref 0.0–0.7)
Eosinophils Absolute: 0 10*3/uL (ref 0.0–0.7)
Eosinophils Absolute: 0.2 10*3/uL (ref 0.0–0.7)
Eosinophils Relative: 0 % (ref 0–5)
Eosinophils Relative: 1 % (ref 0–5)
Eosinophils Relative: 7 % — ABNORMAL HIGH (ref 0–5)
Lymphocytes Relative: 45 % (ref 12–46)
Lymphocytes Relative: 46 % (ref 12–46)
Lymphocytes Relative: 47 % — ABNORMAL HIGH (ref 12–46)
Lymphs Abs: 1.6 10*3/uL (ref 0.7–4.0)
Lymphs Abs: 1.9 10*3/uL (ref 0.7–4.0)
Metamyelocytes Relative: 1 %
Monocytes Absolute: 0.1 10*3/uL (ref 0.1–1.0)
Monocytes Absolute: 0.3 10*3/uL (ref 0.1–1.0)
Monocytes Relative: 10 % (ref 3–12)
Monocytes Relative: 4 % (ref 3–12)
Monocytes Relative: 7 % (ref 3–12)
Neutro Abs: 1.3 10*3/uL — ABNORMAL LOW (ref 1.7–7.7)
Neutro Abs: 1.8 10*3/uL (ref 1.7–7.7)
Neutro Abs: 1.8 10*3/uL (ref 1.7–7.7)
Neutrophils Relative %: 38 % — ABNORMAL LOW (ref 43–77)
Neutrophils Relative %: 50 % (ref 43–77)
Promyelocytes Absolute: 0 %
Smear Review: DECREASED
nRBC: 0 /100 WBC

## 2010-06-11 LAB — URINE MICROSCOPIC-ADD ON

## 2010-06-11 LAB — CROSSMATCH: Antibody Screen: NEGATIVE

## 2010-06-11 LAB — CBC
HCT: 23.5 % — ABNORMAL LOW (ref 39.0–52.0)
HCT: 24.1 % — ABNORMAL LOW (ref 39.0–52.0)
HCT: 28.6 % — ABNORMAL LOW (ref 39.0–52.0)
Hemoglobin: 6.6 g/dL — CL (ref 13.0–17.0)
Hemoglobin: 6.9 g/dL — CL (ref 13.0–17.0)
Hemoglobin: 7.8 g/dL — ABNORMAL LOW (ref 13.0–17.0)
Hemoglobin: 7.8 g/dL — ABNORMAL LOW (ref 13.0–17.0)
Hemoglobin: 7.9 g/dL — ABNORMAL LOW (ref 13.0–17.0)
MCHC: 33.2 g/dL (ref 30.0–36.0)
MCHC: 33.2 g/dL (ref 30.0–36.0)
MCHC: 33.2 g/dL (ref 30.0–36.0)
MCHC: 33.4 g/dL (ref 30.0–36.0)
MCV: 84.7 fL (ref 78.0–100.0)
MCV: 84.8 fL (ref 78.0–100.0)
MCV: 86.2 fL (ref 78.0–100.0)
MCV: 88 fL (ref 78.0–100.0)
Platelets: 101 10*3/uL — ABNORMAL LOW (ref 150–400)
Platelets: 31 10*3/uL — ABNORMAL LOW (ref 150–400)
Platelets: 53 10*3/uL — ABNORMAL LOW (ref 150–400)
Platelets: 65 10*3/uL — ABNORMAL LOW (ref 150–400)
Platelets: 75 10*3/uL — ABNORMAL LOW (ref 150–400)
RBC: 2.24 MIL/uL — ABNORMAL LOW (ref 4.22–5.81)
RBC: 2.41 MIL/uL — ABNORMAL LOW (ref 4.22–5.81)
RBC: 2.42 MIL/uL — ABNORMAL LOW (ref 4.22–5.81)
RBC: 2.8 MIL/uL — ABNORMAL LOW (ref 4.22–5.81)
RDW: 18.5 % — ABNORMAL HIGH (ref 11.5–15.5)
RDW: 18.6 % — ABNORMAL HIGH (ref 11.5–15.5)
RDW: 18.9 % — ABNORMAL HIGH (ref 11.5–15.5)
RDW: 19 % — ABNORMAL HIGH (ref 11.5–15.5)
WBC: 3 10*3/uL — ABNORMAL LOW (ref 4.0–10.5)
WBC: 3.4 10*3/uL — ABNORMAL LOW (ref 4.0–10.5)
WBC: 3.7 10*3/uL — ABNORMAL LOW (ref 4.0–10.5)
WBC: 4 10*3/uL (ref 4.0–10.5)

## 2010-06-11 LAB — IRON AND TIBC
Iron: 122 ug/dL (ref 42–135)
Saturation Ratios: 48 % (ref 20–55)
UIBC: 131 ug/dL

## 2010-06-11 LAB — COMPREHENSIVE METABOLIC PANEL
ALT: 41 U/L (ref 0–53)
ALT: 43 U/L (ref 0–53)
AST: 129 U/L — ABNORMAL HIGH (ref 0–37)
AST: 139 U/L — ABNORMAL HIGH (ref 0–37)
Albumin: 2.2 g/dL — ABNORMAL LOW (ref 3.5–5.2)
Albumin: 2.4 g/dL — ABNORMAL LOW (ref 3.5–5.2)
Alkaline Phosphatase: 120 U/L — ABNORMAL HIGH (ref 39–117)
Alkaline Phosphatase: 95 U/L (ref 39–117)
BUN: 4 mg/dL — ABNORMAL LOW (ref 6–23)
CO2: 22 mEq/L (ref 19–32)
Calcium: 7.6 mg/dL — ABNORMAL LOW (ref 8.4–10.5)
Calcium: 7.7 mg/dL — ABNORMAL LOW (ref 8.4–10.5)
Calcium: 7.8 mg/dL — ABNORMAL LOW (ref 8.4–10.5)
Chloride: 107 mEq/L (ref 96–112)
Creatinine, Ser: 0.91 mg/dL (ref 0.4–1.5)
GFR calc Af Amer: >60 mL/min (ref >60)
GFR calc non Af Amer: >60 mL/min (ref >60)
Glucose, Bld: 104 mg/dL — ABNORMAL HIGH (ref 70–99)
Potassium: 3.4 mEq/L — ABNORMAL LOW (ref 3.5–5.1)
Potassium: 3.6 mEq/L (ref 3.5–5.1)
Potassium: 3.7 mEq/L (ref 3.5–5.1)
Sodium: 141 mEq/L (ref 135–145)
Sodium: 142 mEq/L (ref 135–145)
Total Bilirubin: 3.3 mg/dL — ABNORMAL HIGH (ref 0.3–1.2)
Total Protein: 6.2 g/dL (ref 6.0–8.3)
Total Protein: 6.6 g/dL (ref 6.0–8.3)

## 2010-06-11 LAB — BODY FLUID CELL COUNT WITH DIFFERENTIAL
Lymphs, Fluid: 51 %
Monocyte-Macrophage-Serous Fluid: 48 % — ABNORMAL LOW (ref 50–90)
Total Nucleated Cell Count, Fluid: 34 cu mm (ref 0–1000)

## 2010-06-11 LAB — URINALYSIS, ROUTINE W REFLEX MICROSCOPIC
Hgb urine dipstick: NEGATIVE
Specific Gravity, Urine: 1.015 (ref 1.005–1.030)
Urobilinogen, UA: 1 mg/dL (ref 0.0–1.0)

## 2010-06-11 LAB — HEPATITIS PANEL, ACUTE
HCV Ab: NEGATIVE
Hep A IgM: NEGATIVE
Hep B C IgM: NEGATIVE
Hepatitis B Surface Ag: NEGATIVE

## 2010-06-11 LAB — PREPARE FRESH FROZEN PLASMA

## 2010-06-11 LAB — PROTIME-INR
INR: 2.39 — ABNORMAL HIGH (ref 0.00–1.49)
Prothrombin Time: 21 seconds — ABNORMAL HIGH (ref 11.6–15.2)
Prothrombin Time: 25.9 seconds — ABNORMAL HIGH (ref 11.6–15.2)

## 2010-06-11 LAB — VITAMIN B12: Vitamin B-12: 1229 pg/mL — ABNORMAL HIGH (ref 211–911)

## 2010-06-11 LAB — ETHANOL: Alcohol, Ethyl (B): 392 mg/dL — ABNORMAL HIGH (ref 0–10)

## 2010-06-11 LAB — BODY FLUID CULTURE: Culture: NO GROWTH

## 2010-06-14 LAB — BASIC METABOLIC PANEL
CO2: 26 mEq/L (ref 19–32)
Chloride: 99 mEq/L (ref 96–112)
Chloride: 99 mEq/L (ref 96–112)
Creatinine, Ser: 0.84 mg/dL (ref 0.4–1.5)
GFR calc Af Amer: >60 mL/min (ref >60)
GFR calc non Af Amer: >60 mL/min (ref >60)
Glucose, Bld: 98 mg/dL (ref 70–99)
Potassium: 3.7 mEq/L (ref 3.5–5.1)
Potassium: 4 mEq/L (ref 3.5–5.1)
Sodium: 129 mEq/L — ABNORMAL LOW (ref 135–145)

## 2010-06-14 LAB — CBC
HCT: 29 % — ABNORMAL LOW (ref 39.0–52.0)
Hemoglobin: 9.4 g/dL — ABNORMAL LOW (ref 13.0–17.0)
MCHC: 32.3 g/dL (ref 30.0–36.0)
MCV: 89.1 fL (ref 78.0–100.0)
RBC: 3.26 MIL/uL — ABNORMAL LOW (ref 4.22–5.81)

## 2010-06-14 LAB — AMMONIA: Ammonia: 88 umol/L — ABNORMAL HIGH (ref 11–35)

## 2010-06-22 ENCOUNTER — Encounter (INDEPENDENT_AMBULATORY_CARE_PROVIDER_SITE_OTHER): Payer: Self-pay | Admitting: Internal Medicine

## 2010-06-22 LAB — CONVERTED CEMR LAB
ALT: 27 units/L (ref 0–53)
AST: 44 units/L — ABNORMAL HIGH (ref 0–37)
BUN: 12 mg/dL (ref 6–23)
Basophils Absolute: 0 10*3/uL (ref 0.0–0.1)
Basophils Relative: 0 % (ref 0–1)
Calcium: 9 mg/dL (ref 8.4–10.5)
Creatinine, Ser: 0.86 mg/dL (ref 0.40–1.50)
Eosinophils Relative: 3 % (ref 0–5)
HCT: 42.7 % (ref 39.0–52.0)
Hemoglobin: 14.9 g/dL (ref 13.0–17.0)
MCHC: 34.9 g/dL (ref 30.0–36.0)
MCV: 92.2 fL (ref 78.0–100.0)
Monocytes Absolute: 0.5 10*3/uL (ref 0.1–1.0)
Monocytes Relative: 11 % (ref 3–12)
RBC: 4.63 M/uL (ref 4.22–5.81)
RDW: 14.4 % (ref 11.5–15.5)
Total Bilirubin: 1.3 mg/dL — ABNORMAL HIGH (ref 0.3–1.2)

## 2010-08-11 NOTE — H&P (Signed)
NAME:  John Flowers, John Flowers NO.:  [id number]   MEDICAL RECORD NO.:  [id number]                   PATIENT TYPE:  IPS   LOCATION:  0500                                 FACILITY:  [facility]   PHYSICIAN:  Geoffery Lyons, M.D.                   DATE OF BIRTH:  [birth year]   DATE OF ADMISSION:  02/05/2002  DATE OF DISCHARGE:                         PSYCHIATRIC ADMISSION ASSESSMENT   IDENTIFYING INFORMATION:  A 40 year old single white male, voluntarily  admitted on February 05, 2002.   HISTORY OF PRESENT ILLNESS:  The patient presents with a history of alcohol  abuse.  He relapsed on alcohol 7 or 8 months ago.  He reports that he needs  help.  He is trying to slow down his drinking.  He reports that he again  just needs help to stop drinking.  He has been having irregular sleeping  patterns.  Although he has decreased appetite he states he has been gaining  weight.  He reports some depressive symptoms but denies any suicidal  ideation or homicidal ideation or psychotic symptoms.  He feels that alcohol  makes things more crystal clear and he feels he is happier when he drinks.  He denies any withdrawal symptoms at present.   PAST PSYCHIATRIC HISTORY:  First hospitalization at [facility], was detoxed prior at [facility] 1 year ago, also detoxed at  [place].  No outpatient treatment.   SOCIAL HISTORY:  He is a 40 year old single white male with no children.  He  lives with his mother.  He is searching for work.  He has completed some  college courses.  He has a court date in 2 days for communicating threats to  an Technical sales engineer.   FAMILY HISTORY:  Grandmother with bipolar disorder and some OCD in family  history.   ALCOHOL DRUG HISTORY:  The patient smokes.  He has been drinking a fifth of  vodka a day for the past 7-8 months.  He has been drinking for years.  He  has a history of blackouts with no seizures.  He drinks alone.  His longest  history of  sobriety has been 3-4 years back in 1990s.  His last drink was on  February 04, 2002.   PAST MEDICAL HISTORY:  Primary care James Lafalce is none.  Medical problems are  none.  Medications are none.   DRUG ALLERGIES:  No known allergies.   PHYSICAL EXAMINATION:  Performed at [facility].  The patient appears very  cooperative, well-nourished, and with no withdrawal symptoms noted.  Skin  color is good.  His blood pressure is 142/86.   LABORATORY DATA:  Urine drug screen is negative.  Alcohol level is 228 on  arrival to the emergency department.   MENTAL STATUS EXAM:  He is an alert, young middle-aged male, casually  dressed, calm and cooperative.  Speech is clear, soft-spoken, mood is  anxious, affect  is flat and anxious.  The patient laughing inappropriately  at times to questioning.  Thought processes are coherent, no evidence of  psychosis, no auditory or visual hallucinations, no suicidal or homicidal  ideation.  Cognitive function intact.  Judgment and insight are poor, poor  impulse control.   ADMISSION DIAGNOSES:    AXIS I:  1. Alcohol dependence.  2. Depression not otherwise specified.   AXIS II:  Deferred.   AXIS III:  None.   AXIS IV:  Problems with occupation and other psychosocial problems and legal  system.   AXIS V:  Current is 40, estimated this past year 13.   PLAN:  Voluntary admission to [facility] for alcohol  dependence, requesting detox.  Contract for safety, check every 15 minutes.  Will initiate the phenobarbital protocol to detox safely.  Will encourage  fluids, will initiate an antidepressant for anxiety, have trazodone  available for sleep.  We will have a family session prior to discharge.  The  patient to attend AA meetings, remain alcohol free, be medication compliant.   TENTATIVE LENGTH OF CARE:  4-5 days.       Landry Corporal, N.P.                       Geoffery Lyons, M.D.    JO/MEDQ  D:  02/06/2002  T:  02/07/2002  Job:   [id number]

## 2010-08-11 NOTE — Discharge Summary (Signed)
NAME:  John Flowers, John Flowers NO.:  [id number]   MEDICAL RECORD NO.:  [id number]                   PATIENT TYPE:  IPS   LOCATION:  0500                                 FACILITY:  [facility]   PHYSICIAN:  Geoffery Lyons, M.D.                   DATE OF BIRTH:  1970/09/21   DATE OF ADMISSION:  02/05/2002  DATE OF DISCHARGE:  02/10/2002                                 DISCHARGE SUMMARY   CHIEF COMPLAINT AND PRESENT ILLNESS:  This was the first admission to [facility] for this 40 year old single white male  voluntarily admitted.  He had a history of alcohol abuse, relapsed on  alcohol seven or eight months ago.  He wanted help.  He was trying to slow  down his drinking but he had not been able to do so.  He claimed decreased  appetite, irregular sleeping pattern, some depressive symptoms, but denied  any active suicidal or homicidal ideas.   PAST PSYCHIATRIC HISTORY:  This was the first time at [facility].  He was previously detox[place] [facility] [place]  He had no ongoing outpatient treatment.   SUBSTANCE ABUSE HISTORY:  He was drinking a fifth of vodka a day for the  past seven to eight months.  He had a history of blackouts, no seizures.  He  drank alone.  No other substances.   PHYSICAL EXAMINATION:  Physical examination was performed at [facility]  Emergency Room and failed to show any acute findings.   MENTAL STATUS EXAM:  Mental status exam upon admission revealed an alert,  young middle-aged male, casually dressed, calm, cooperative.  Speech was  clear, soft spoken.  Mood was anxious.  Affect was anxious, at times  laughing inappropriately at some of the questions.  Thought processes were  coherent, relevant; no evidence of psychosis, no auditory and visual  hallucinations, no suicidal or homicidal ideation.  Cognitive: Cognition was  well preserved.   ADMISSION DIAGNOSES:   AXIS I:   Alcohol dependence.   AXIS II:  No diagnosis.   AXIS III:  No diagnosis.   AXIS IV:  Moderate.   AXIS V:  Global assessment of functioning upon admission 35-40, highest  global assessment of functioning in the last year 65-70.   LABORATORY DATA:  Other laboratory workup: CBC was within normal limits.  Blood chemistries were within normal limits.  Upon admission, SGOT was 169  and SGPT was 81 on November 13.  Four days later on November 17, SGOT was  109, SGPT was 69.  Thyroid profile was within normal limits.  Drug screen  was negative for substances of abuse.   HOSPITAL COURSE:  He was admitted and started in intensive individual and  group psychotherapy.  He was detoxified using phenobarbital.  Due to some  underlying depressive symptomatology, he was started on Zoloft  that was  increased up to 50 mg per day.  He endorsed that he did not do much, stayed  home, a lot of anxiety when sober, issues of social anxiety.  He tolerated  the Zoloft 25 mg and tolerated the Zoloft going up to 50 mg.  By November  18, he was in full contact with reality, fully detoxified, had tolerated the  Zoloft well.  He had a relapse prevention plan.  He had worked on some  cognitive-behavioral ways of dealing with the anxiety.  He was willing to  come to [facility] IOP for further stabilization.  He was considered for ReVia, but  upon admission, the liver enzymes were extremely high so we went ahead and  held the ReVia and started Topamax 25 mg twice a day.  Upon discharge, he  was in full contact with reality, no suicidal or homicidal ideas, willing  and motivated to pursue further outpatient treatment.   DISCHARGE DIAGNOSES:   AXIS I:  1. Alcohol dependence.  2. Anxiety disorder, not otherwise specified.  3. Depressive disorder, not otherwise specified.   AXIS II:  No diagnosis.   AXIS III:  No diagnosis.   AXIS IV:  Moderate.   AXIS V:  Global assessment of functioning upon discharge 50.    DISCHARGE MEDICATIONS:  1. Zoloft 50 mg per day.  2. Antabuse 250 mg daily.  3. Topamax 25 mg twice a day.   FOLLOW UP:  He was to followup at [facility].                                                Geoffery Lyons, M.D.    IL/MEDQ  D:  03/31/2002  T:  03/31/2002  Job:  [id number]

## 2011-12-13 ENCOUNTER — Encounter: Payer: Self-pay | Admitting: Gastroenterology

## 2015-11-04 ENCOUNTER — Emergency Department (HOSPITAL_COMMUNITY)
Admission: EM | Admit: 2015-11-04 | Discharge: 2015-11-04 | Disposition: A | Discharge status: Home / Self Care | Payer: Self-pay | Attending: Emergency Medicine | Admitting: Emergency Medicine

## 2015-11-04 ENCOUNTER — Encounter (HOSPITAL_COMMUNITY): Payer: Self-pay | Admitting: Emergency Medicine

## 2015-11-04 ENCOUNTER — Emergency Department (HOSPITAL_COMMUNITY): Payer: Self-pay

## 2015-11-04 DIAGNOSIS — R569 Unspecified convulsions: Secondary | ICD-10-CM | POA: Insufficient documentation

## 2015-11-04 DIAGNOSIS — F17210 Nicotine dependence, cigarettes, uncomplicated: Secondary | ICD-10-CM | POA: Insufficient documentation

## 2015-11-04 DIAGNOSIS — R51 Headache: Secondary | ICD-10-CM | POA: Insufficient documentation

## 2015-11-04 HISTORY — DX: Depression, unspecified: F32.A

## 2015-11-04 HISTORY — DX: Unspecified cirrhosis of liver: K74.60

## 2015-11-04 HISTORY — DX: Major depressive disorder, single episode, unspecified: F32.9

## 2015-11-04 LAB — URINE MICROSCOPIC-ADD ON

## 2015-11-04 LAB — I-STAT VENOUS BLOOD GAS, ED
ACID-BASE DEFICIT: 6 mmol/L — AB (ref 0.0–2.0)
Bicarbonate: 18.9 mEq/L — ABNORMAL LOW (ref 20.0–24.0)
O2 Saturation: 59 %
PH VEN: 7.34 — AB (ref 7.250–7.300)
TCO2: 20 mmol/L (ref 0–100)
pCO2, Ven: 35 mmHg — ABNORMAL LOW (ref 45.0–50.0)
pO2, Ven: 32 mmHg (ref 31.0–45.0)

## 2015-11-04 LAB — CBG MONITORING, ED
GLUCOSE-CAPILLARY: 106 mg/dL — AB (ref 65–99)
Glucose-Capillary: 96 mg/dL (ref 65–99)

## 2015-11-04 LAB — COMPREHENSIVE METABOLIC PANEL
ALK PHOS: 60 U/L (ref 38–126)
ALT: 26 U/L (ref 17–63)
AST: 54 U/L — AB (ref 15–41)
Albumin: 4.9 g/dL (ref 3.5–5.0)
BUN: 12 mg/dL (ref 6–20)
CHLORIDE: 104 mmol/L (ref 101–111)
CREATININE: 1.32 mg/dL — AB (ref 0.61–1.24)
Calcium: 9.5 mg/dL (ref 8.9–10.3)
GFR calc Af Amer: >60 mL/min (ref >60)
GFR calc non Af Amer: >60 mL/min (ref >60)
GLUCOSE: 96 mg/dL (ref 65–99)
Potassium: 4.5 mmol/L (ref 3.5–5.1)
SODIUM: 138 mmol/L (ref 135–145)
Total Bilirubin: 2 mg/dL — ABNORMAL HIGH (ref 0.3–1.2)
Total Protein: 7.9 g/dL (ref 6.5–8.1)

## 2015-11-04 LAB — URINALYSIS, ROUTINE W REFLEX MICROSCOPIC
Bilirubin Urine: NEGATIVE
Glucose, UA: NEGATIVE mg/dL
Ketones, ur: 15 mg/dL — AB
LEUKOCYTES UA: NEGATIVE
Nitrite: NEGATIVE
PROTEIN: 30 mg/dL — AB
SPECIFIC GRAVITY, URINE: 1.019 (ref 1.005–1.030)
pH: 5.5 (ref 5.0–8.0)

## 2015-11-04 LAB — CBC
HCT: 56.4 % — ABNORMAL HIGH (ref 39.0–52.0)
HEMOGLOBIN: 18.6 g/dL — AB (ref 13.0–17.0)
MCH: 30.7 pg (ref 26.0–34.0)
MCHC: 33 g/dL (ref 30.0–36.0)
MCV: 93.1 fL (ref 78.0–100.0)
PLATELETS: 127 10*3/uL — AB (ref 150–400)
RBC: 6.06 MIL/uL — AB (ref 4.22–5.81)
RDW: 13.5 % (ref 11.5–15.5)
WBC: 11 10*3/uL — AB (ref 4.0–10.5)

## 2015-11-04 LAB — RAPID URINE DRUG SCREEN, HOSP PERFORMED
Amphetamines: NOT DETECTED
BARBITURATES: NOT DETECTED
BENZODIAZEPINES: NOT DETECTED
Cocaine: NOT DETECTED
Opiates: NOT DETECTED
Tetrahydrocannabinol: POSITIVE — AB

## 2015-11-04 LAB — ETHANOL: Alcohol, Ethyl (B): <5 mg/dL (ref <5)

## 2015-11-04 MED ORDER — LORAZEPAM 2 MG/ML IJ SOLN
1.0000 mg | Freq: Once | INTRAMUSCULAR | Status: AC
  Administered 2015-11-04: 1 mg via INTRAVENOUS
  Filled 2015-11-04: qty 1

## 2015-11-04 MED ORDER — SODIUM CHLORIDE 0.9 % IV BOLUS (SEPSIS)
1000.0000 mL | Freq: Once | INTRAVENOUS | Status: AC
  Administered 2015-11-04: 1000 mL via INTRAVENOUS

## 2015-11-04 NOTE — ED Notes (Signed)
This RN made Dr. Madilyn Hookees aware that patient is in room, new onset of seizures today and is diaphoretic and tachycardic.

## 2015-11-04 NOTE — ED Notes (Signed)
CT made aware that patient needs head CT as soon as possible, per Dr. Madilyn Hookees' request.

## 2015-11-04 NOTE — ED Notes (Signed)
Patient transported to CT with this RN at bedside

## 2015-11-04 NOTE — ED Notes (Signed)
Patient aware we need a UA.Urinial at bedside.

## 2015-11-04 NOTE — ED Notes (Signed)
Pt unable to obtain urine specimen at this time 

## 2015-11-04 NOTE — ED Notes (Signed)
Patient CBG was 106. 
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
## 2015-11-04 NOTE — ED Notes (Signed)
CBG 96. 

## 2015-11-04 NOTE — ED Notes (Signed)
MD at bedside. 
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
## 2015-11-04 NOTE — ED Notes (Signed)
Pt given apple juice  

## 2016-11-17 ENCOUNTER — Emergency Department (HOSPITAL_COMMUNITY)
Admission: EM | Admit: 2016-11-17 | Discharge: 2016-11-19 | Disposition: A | Discharge status: Home / Self Care | Payer: Self-pay | Attending: Emergency Medicine | Admitting: Emergency Medicine

## 2016-11-17 ENCOUNTER — Encounter (HOSPITAL_COMMUNITY): Payer: Self-pay | Admitting: Emergency Medicine

## 2016-11-17 ENCOUNTER — Emergency Department (HOSPITAL_COMMUNITY): Payer: Self-pay

## 2016-11-17 DIAGNOSIS — R45851 Suicidal ideations: Secondary | ICD-10-CM | POA: Insufficient documentation

## 2016-11-17 DIAGNOSIS — R0602 Shortness of breath: Secondary | ICD-10-CM | POA: Insufficient documentation

## 2016-11-17 DIAGNOSIS — F309 Manic episode, unspecified: Secondary | ICD-10-CM

## 2016-11-17 DIAGNOSIS — F302 Manic episode, severe with psychotic symptoms: Secondary | ICD-10-CM | POA: Insufficient documentation

## 2016-11-17 DIAGNOSIS — F17210 Nicotine dependence, cigarettes, uncomplicated: Secondary | ICD-10-CM | POA: Insufficient documentation

## 2016-11-17 LAB — COMPREHENSIVE METABOLIC PANEL
ALBUMIN: 3 g/dL — AB (ref 3.5–5.0)
ALK PHOS: 55 U/L (ref 38–126)
ALT: 28 U/L (ref 17–63)
ALT: 22 U/L (ref 17–63)
ANION GAP: 15 (ref 5–15)
AST: 44 U/L — ABNORMAL HIGH (ref 15–41)
AST: 38 U/L (ref 15–41)
Albumin: 4.5 g/dL (ref 3.5–5.0)
Alkaline Phosphatase: 83 U/L (ref 38–126)
Anion gap: 20 — ABNORMAL HIGH (ref 5–15)
BUN: 6 mg/dL (ref 6–20)
BUN: 10 mg/dL (ref 6–20)
CHLORIDE: 111 mmol/L (ref 101–111)
CO2: 19 mmol/L — ABNORMAL LOW (ref 22–32)
CO2: 15 mmol/L — ABNORMAL LOW (ref 22–32)
Calcium: 8.7 mg/dL — ABNORMAL LOW (ref 8.9–10.3)
Calcium: 6.6 mg/dL — ABNORMAL LOW (ref 8.9–10.3)
Chloride: 103 mmol/L (ref 101–111)
Creatinine, Ser: 0.91 mg/dL (ref 0.61–1.24)
Creatinine, Ser: 0.76 mg/dL (ref 0.61–1.24)
GFR calc Af Amer: >60 mL/min (ref >60)
GFR calc non Af Amer: >60 mL/min (ref >60)
GFR calc non Af Amer: >60 mL/min (ref >60)
GLUCOSE: 61 mg/dL — AB (ref 65–99)
Glucose, Bld: 71 mg/dL (ref 65–99)
POTASSIUM: 3.7 mmol/L (ref 3.5–5.1)
Potassium: 3.9 mmol/L (ref 3.5–5.1)
SODIUM: 141 mmol/L (ref 135–145)
Sodium: 142 mmol/L (ref 135–145)
Total Bilirubin: 1.2 mg/dL (ref 0.3–1.2)
Total Bilirubin: 1 mg/dL (ref 0.3–1.2)
Total Protein: 7.5 g/dL (ref 6.5–8.1)
Total Protein: 5.4 g/dL — ABNORMAL LOW (ref 6.5–8.1)

## 2016-11-17 LAB — CBC
HEMATOCRIT: 52.2 % — AB (ref 39.0–52.0)
Hemoglobin: 18 g/dL — ABNORMAL HIGH (ref 13.0–17.0)
MCH: 29.6 pg (ref 26.0–34.0)
MCHC: 34.5 g/dL (ref 30.0–36.0)
MCV: 85.7 fL (ref 78.0–100.0)
PLATELETS: 106 10*3/uL — AB (ref 150–400)
RBC: 6.09 MIL/uL — ABNORMAL HIGH (ref 4.22–5.81)
RDW: 14.3 % (ref 11.5–15.5)
WBC: 13.4 10*3/uL — ABNORMAL HIGH (ref 4.0–10.5)

## 2016-11-17 LAB — RAPID URINE DRUG SCREEN, HOSP PERFORMED
Amphetamines: NOT DETECTED
BARBITURATES: NOT DETECTED
Benzodiazepines: NOT DETECTED
Cocaine: NOT DETECTED
Opiates: NOT DETECTED
Tetrahydrocannabinol: POSITIVE — AB

## 2016-11-17 LAB — D-DIMER, QUANTITATIVE (NOT AT ARMC): D DIMER QUANT: 10.01 ug{FEU}/mL — AB (ref 0.00–0.50)

## 2016-11-17 LAB — I-STAT VENOUS BLOOD GAS, ED
Acid-base deficit: 4 mmol/L — ABNORMAL HIGH (ref 0.0–2.0)
Bicarbonate: 21.2 mmol/L (ref 20.0–28.0)
O2 SAT: 80 %
PCO2 VEN: 36.7 mmHg — AB (ref 44.0–60.0)
PH VEN: 7.366 (ref 7.250–7.430)
TCO2: 22 mmol/L (ref 22–32)
pO2, Ven: 44 mmHg (ref 32.0–45.0)

## 2016-11-17 LAB — ETHANOL
ALCOHOL ETHYL (B): 320 mg/dL — AB (ref <5)
ALCOHOL ETHYL (B): 183 mg/dL — AB (ref <5)

## 2016-11-17 LAB — ACETAMINOPHEN LEVEL: Acetaminophen (Tylenol), Serum: <10 ug/mL — ABNORMAL LOW (ref 10–30)

## 2016-11-17 LAB — D-DIMER, QUANTITATIVE: D-Dimer, Quant: 12.78 ug/mL-FEU — ABNORMAL HIGH (ref 0.00–0.50)

## 2016-11-17 LAB — FOLATE: FOLATE: 11 ng/mL (ref >5.9)

## 2016-11-17 LAB — SALICYLATE LEVEL: Salicylate Lvl: <7 mg/dL (ref 2.8–30.0)

## 2016-11-17 LAB — I-STAT TROPONIN, ED: TROPONIN I, POC: 0 ng/mL (ref 0.00–0.08)

## 2016-11-17 LAB — TSH: TSH: 0.376 u[IU]/mL (ref 0.350–4.500)

## 2016-11-17 MED ORDER — LORAZEPAM 2 MG/ML IJ SOLN: 0.0000 mg | Freq: Two times a day (BID) | INTRAMUSCULAR | Status: DC

## 2016-11-17 MED ORDER — ONDANSETRON HCL 4 MG PO TABS: 4.0000 mg | ORAL_TABLET | Freq: Three times a day (TID) | ORAL | Status: DC | PRN

## 2016-11-17 MED ORDER — VITAMIN B-1 100 MG PO TABS
100.0000 mg | ORAL_TABLET | Freq: Once | ORAL | Status: AC
  Administered 2016-11-17: 100 mg via ORAL
  Filled 2016-11-17: qty 1

## 2016-11-17 MED ORDER — NICOTINE 21 MG/24HR TD PT24: 21.0000 mg | MEDICATED_PATCH | Freq: Every day | TRANSDERMAL | Status: DC

## 2016-11-17 MED ORDER — SODIUM CHLORIDE 0.9 % IV BOLUS (SEPSIS)
1000.0000 mL | Freq: Once | INTRAVENOUS | Status: AC
  Administered 2016-11-17: 1000 mL via INTRAVENOUS

## 2016-11-17 MED ORDER — ALUM & MAG HYDROXIDE-SIMETH 200-200-20 MG/5ML PO SUSP: 30.0000 mL | Freq: Four times a day (QID) | ORAL | Status: DC | PRN

## 2016-11-17 MED ORDER — FOLIC ACID 1 MG PO TABS
1.0000 mg | ORAL_TABLET | Freq: Once | ORAL | Status: AC
  Administered 2016-11-17: 1 mg via ORAL
  Filled 2016-11-17: qty 1

## 2016-11-17 MED ORDER — IBUPROFEN 200 MG PO TABS
600.0000 mg | ORAL_TABLET | Freq: Three times a day (TID) | ORAL | Status: DC | PRN
  Administered 2016-11-17 – 2016-11-18 (×3): 600 mg via ORAL
  Filled 2016-11-17 (×3): qty 1

## 2016-11-17 MED ORDER — LORAZEPAM 2 MG/ML IJ SOLN
1.0000 mg | Freq: Once | INTRAMUSCULAR | Status: AC
  Administered 2016-11-17: 1 mg via INTRAVENOUS
  Filled 2016-11-17: qty 1

## 2016-11-17 MED ORDER — LORAZEPAM 2 MG/ML IJ SOLN: 0.0000 mg | Freq: Four times a day (QID) | INTRAMUSCULAR | Status: DC

## 2016-11-17 MED ORDER — LORAZEPAM 1 MG PO TABS: 0.0000 mg | ORAL_TABLET | Freq: Two times a day (BID) | ORAL | Status: DC

## 2016-11-17 MED ORDER — LORAZEPAM 1 MG PO TABS
0.0000 mg | ORAL_TABLET | Freq: Four times a day (QID) | ORAL | Status: DC
  Administered 2016-11-18 (×3): 1 mg via ORAL
  Filled 2016-11-17 (×2): qty 1
  Filled 2016-11-17: qty 2

## 2016-11-17 MED ORDER — IOPAMIDOL (ISOVUE-370) INJECTION 76%
INTRAVENOUS | Status: AC
  Administered 2016-11-17: 100 mL via INTRAVENOUS
  Filled 2016-11-17: qty 100

## 2016-11-17 NOTE — ED Notes (Signed)
Ct called about getting the patient as soon as possible due to ddimer

## 2016-11-17 NOTE — ED Triage Notes (Signed)
Pt has c/o "I feel like I am going to hurt myself or someone-- please put me in handcuffs, I do not want to hurt anyone." pt will not look at staff-- very irritable with any questions.
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
## 2016-11-17 NOTE — ED Notes (Signed)
Patient taken to [facility] with sitter, on tele
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
## 2016-11-17 NOTE — ED Notes (Addendum)
Date and time results received: 11/17/16 1:48 PM   Test: Ethanol Critical Value: 320  Name of Provider Notified: Effie Shy MD

## 2016-11-17 NOTE — ED Notes (Signed)
TTS ready for patient. Will complete when machine is ready.

## 2016-11-17 NOTE — BHH Counselor (Signed)
TTS machine currently in use in another room in Pod A. TTS will be completed once machine is available.   Evette Cristal, [place] Therapeutic Triage Specialist

## 2016-11-17 NOTE — ED Notes (Addendum)
TTS to bedside, TTS staff aware
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
## 2016-11-18 DIAGNOSIS — F1910 Other psychoactive substance abuse, uncomplicated: Secondary | ICD-10-CM

## 2016-11-18 DIAGNOSIS — F332 Major depressive disorder, recurrent severe without psychotic features: Secondary | ICD-10-CM

## 2016-11-18 DIAGNOSIS — F411 Generalized anxiety disorder: Secondary | ICD-10-CM

## 2016-11-18 DIAGNOSIS — F1099 Alcohol use, unspecified with unspecified alcohol-induced disorder: Secondary | ICD-10-CM

## 2016-11-18 DIAGNOSIS — F17210 Nicotine dependence, cigarettes, uncomplicated: Secondary | ICD-10-CM

## 2016-11-18 DIAGNOSIS — R4583 Excessive crying of child, adolescent or adult: Secondary | ICD-10-CM

## 2016-11-18 DIAGNOSIS — F1290 Cannabis use, unspecified, uncomplicated: Secondary | ICD-10-CM

## 2016-11-18 MED ORDER — ZOLPIDEM TARTRATE 5 MG PO TABS
5.0000 mg | ORAL_TABLET | Freq: Every evening | ORAL | Status: DC | PRN
  Administered 2016-11-18 (×2): 5 mg via ORAL
  Filled 2016-11-18 (×2): qty 1

## 2016-11-18 NOTE — ED Notes (Signed)
Pt's mother and another male visiting w/pt - pt eating lunch.

## 2016-11-18 NOTE — ED Notes (Signed)
Mother visiting pt - given pamphlet for visitation hours.

## 2016-11-18 NOTE — ED Notes (Signed)
Pt requesting something to sleep. Md made aware.

## 2016-11-18 NOTE — Progress Notes (Addendum)
Patient meets inpatient treatment criteria, per Nira Conn, NP, on 11/17/16 and per Ferne Reus, NP, on 11/18/16. Patient is voluntary.  Patient has been referred to the following inpatient treatment psychiatric facilities: [address], [address], [place], [place], and [place].  CSW in disposition will continue to seek placement.  Melbourne Abts, MSW, LCSWA Clinical social worker in disposition [facility], TTS Office 11/18/2016 3:24 PM

## 2016-11-18 NOTE — ED Notes (Signed)
States he had been sober then began drinking again over past couple of days d/t stress - states "I thought I could handle it". Pt wearing eyeglasses and paper scrubs. States he is not presently working.

## 2016-11-18 NOTE — ED Notes (Signed)
Regular Diet ordered for Dinner. 
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
## 2016-11-18 NOTE — Progress Notes (Signed)
Per Delila Pereyra NP, patient meets inpatient treatment criteria. [facility] RN Ledon Snare was notified.  Melbourne Abts, MSW, LCSWA Clinical social worker in disposition [facility], TTS Office

## 2016-11-18 NOTE — ED Notes (Signed)
Snack given, Breakfast order collected 

## 2016-11-18 NOTE — ED Notes (Signed)
Re-TTS being performed. Mother has now left.

## 2016-11-18 NOTE — BH Assessment (Signed)
Pt is pleasant and oriented x 4. He reports he doesn't know how much etoh he drank last night. Pt denies SI and HI. He reports that last night he was experiencing severe anxiety. Pt reports he had one suicide attempt "years ago." Pt says he isn't able to get a handle on his anxiety b/c of his alcoholism. Pt says he will start taking psych meds for anxiety but he drinks etoh at the same time. Pt is insightful. He says, "The anxiety builds up and builds up and the drinking didn't work." HE goes on to say, "I just need to find a way to make it (anxiety) stop." He reports his mother is fairly anxious too. Pt reports being embarrased about being admitted to the ED and not being able to control his severe anxiety. Pt reports poor appetite. Pt says he is safe to be d/c. He reports he has a good relationship with his mom, and he lives close to the [facility] if he feels he needs to return. Leighton Ruff will do telepsych to determine disposition.  Evette Cristal, [place] Therapeutic Triage Specialist

## 2016-11-19 MED ORDER — ZOLPIDEM TARTRATE 5 MG PO TABS: 5.0000 mg | ORAL_TABLET | Freq: Every evening | ORAL | 0 refills | Status: DC | PRN

## 2016-11-19 NOTE — ED Notes (Signed)
Declined W/C at D/C and was escorted to lobby by RN. 

## 2016-11-19 NOTE — ED Notes (Signed)
Snack is sitting @ bedside while pt is resting.

## 2016-11-19 NOTE — ED Notes (Signed)
Lunch meal ordered @ 0812. 

## 2016-11-19 NOTE — Progress Notes (Signed)
Pt declined at OV due to medical acuity; [place] [facility] still reviewing but pt no longer meets criteria for inpatient tx. Pt being recommended for d/c.   Carney Bern T. Kaylyn Lim, MSW, LCSWA Disposition Clinical Social Work [phone] (cell) [phone] (office)

## 2016-11-19 NOTE — BH Assessment (Addendum)
[facility] Assessment Progress Note  Pt reassessed this afternoon. Pt denies any SI, HI, AVH. Pt says he feels "better" than he did yesterday. He says he's most concerned about the anxiety that he's been experiencing and that's been building up. Pt reports being a "failure at 47" and discusses his loss of motivation and having no job or money for several years. Pt shares that he had an alcohol addiction and he had been clean for several years but decided to drink within the past week to help calm his anxiety. Now pt reports being also anxious about completely relapsing. Pt is open to receiving resources for OP treatment.   Ferne Reus, NP, recommends d/c with OP MH resources.    Johny Shock. Ladona Ridgel, MS, NCC, LPCA Counselor

## 2016-11-19 NOTE — ED Provider Notes (Signed)
Psychiatry is advising discharge. When I talked to patient, he is no longer tachycardic or anxious. He states the anxiety is on and off. He is not one to kill himself but feels hopeless enough that he will would be okay with dying. However he has no intent. I think outpatient follow-up is reasonable. Given trouble sleeping will give brief course of Ambien but he will need to follow-up with PCP and/or psychiatry. Discussed return precautions.   Pricilla Loveless, MD 11/19/16 [phone]

## 2018-05-22 IMAGING — CT CT ANGIO CHEST
2 of 9 series · 19 of 46 positions shown · IV contrast (OMNI)
Comparison: None.

CLINICAL DATA: Anxiety attack, shortness of breath, elevated
D-dimer.

EXAM:
CT ANGIOGRAPHY CHEST WITH CONTRAST
TECHNIQUE: Multidetector CT imaging of the chest was performed using the
standard protocol during bolus administration of intravenous
contrast. Multiplanar CT image reconstructions and MIPs were
obtained to evaluate the vascular anatomy.
CONTRAST:  100 cc Isovue 370 IV

[Series 6: thins · axial · 0.72mm/px · z∈[+1120,+1394]mm · 16 of 310 slices shown]
[im 18/310  lung]
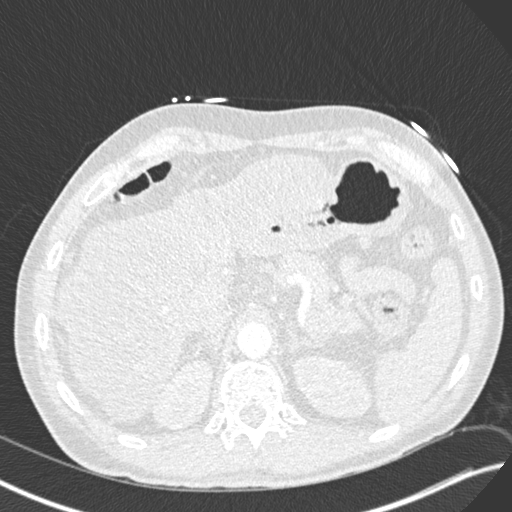
[im 35/310  soft-tissue]
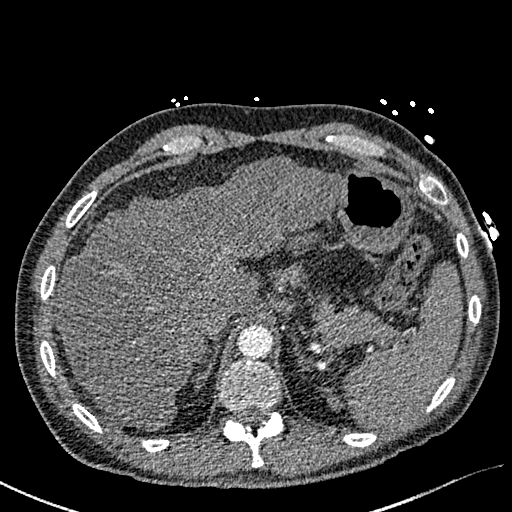
[im 52/310  lung]
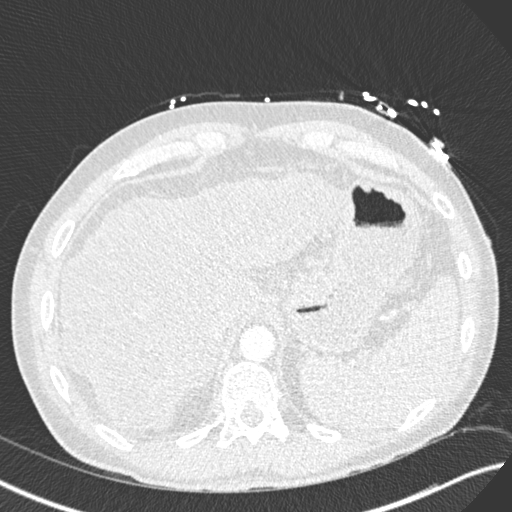
[im 69/310  soft-tissue]
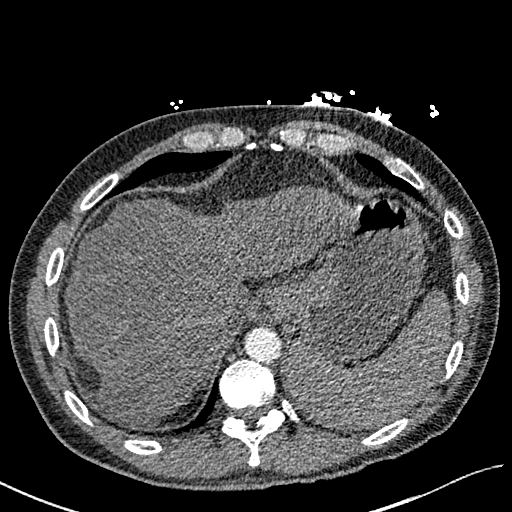
[im 86/310  lung]
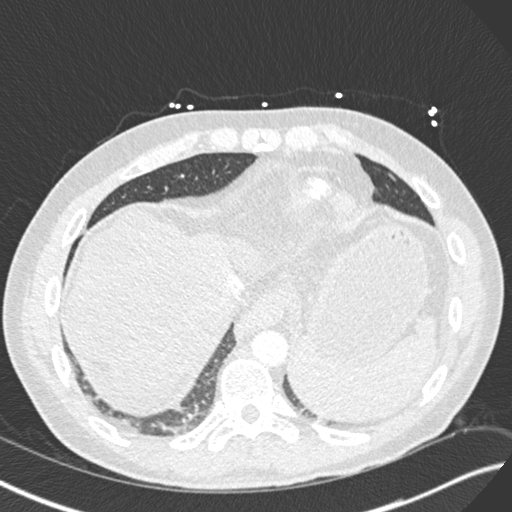
[im 104/310  soft-tissue]
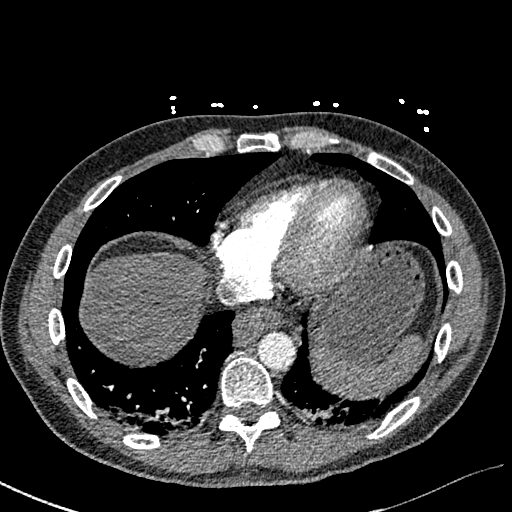
[im 121/310  lung]
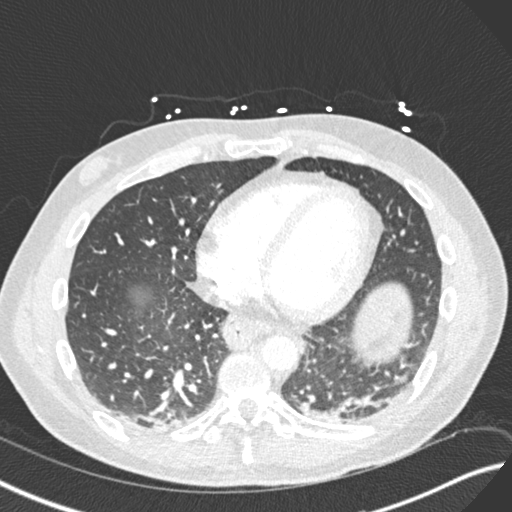
[im 138/310  soft-tissue]
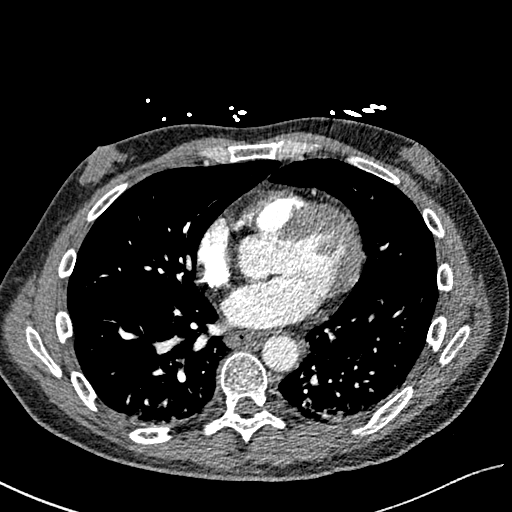
[im 172/310  lung]
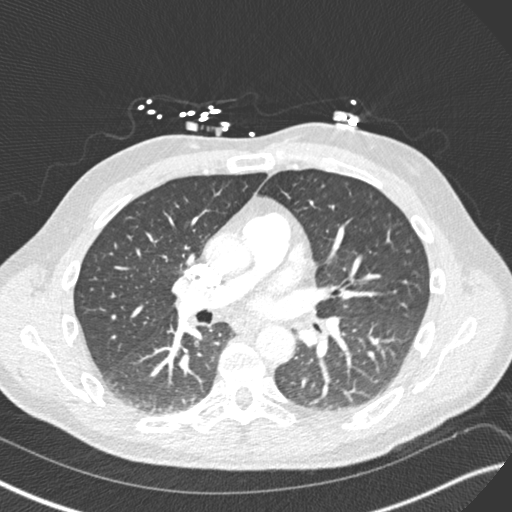
[im 189/310  soft-tissue]
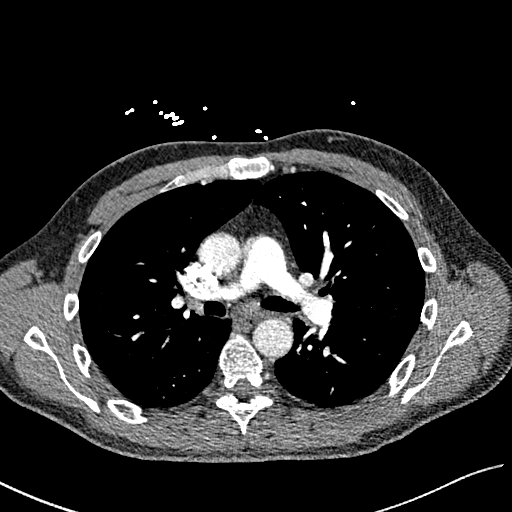
[im 207/310  lung]
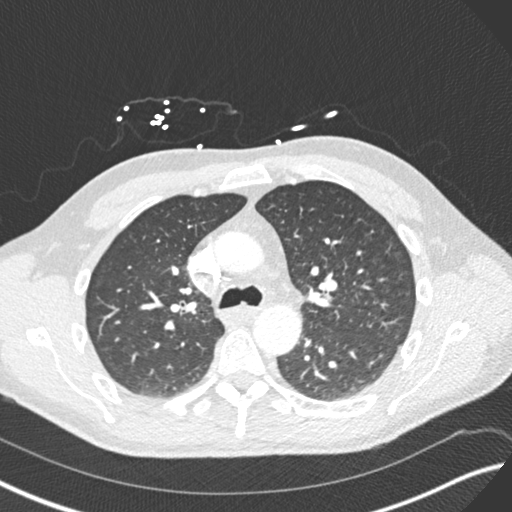
[im 224/310  soft-tissue]
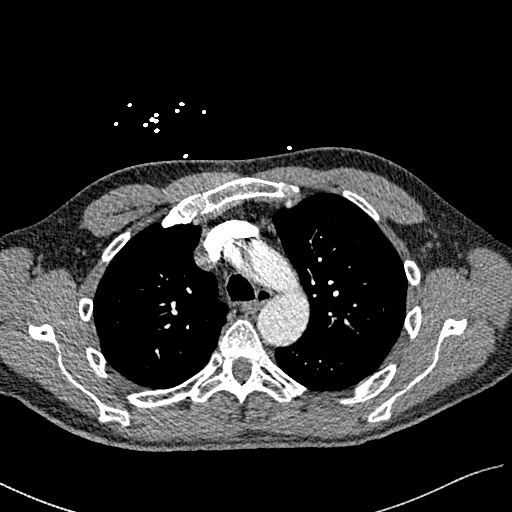
[im 241/310  lung]
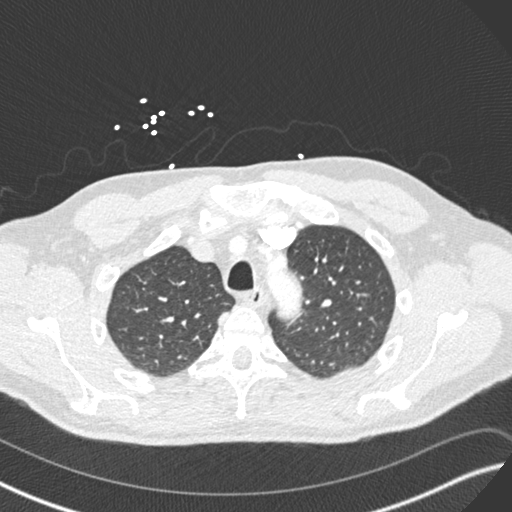
[im 258/310  soft-tissue]
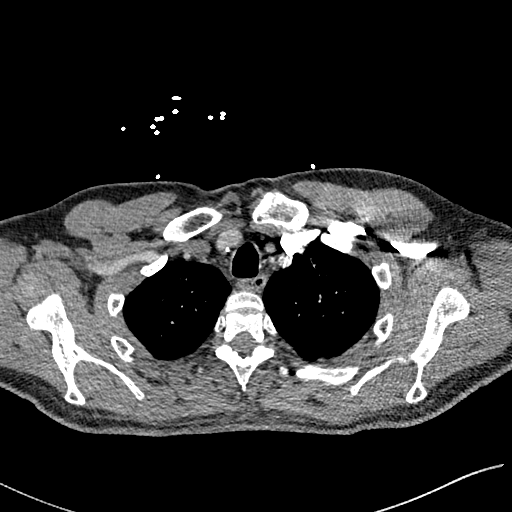
[im 275/310  lung]
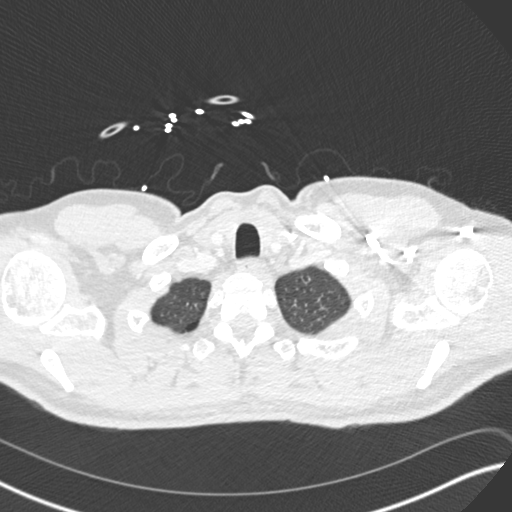
[im 292/310  soft-tissue]
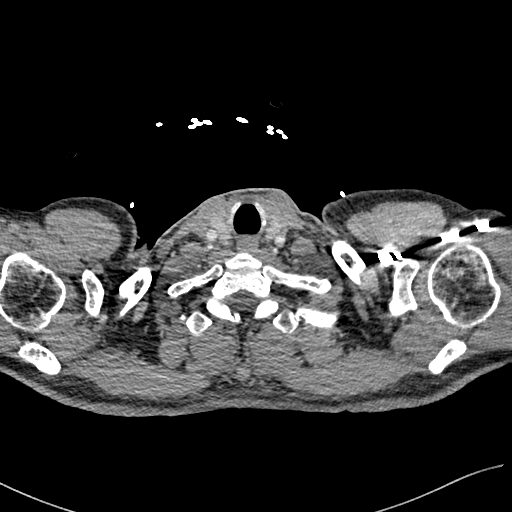

[Series 8: coronal mpr · coronal · 0.61mm/px · 3 of 151 slices shown]
[im 38/151  soft-tissue]
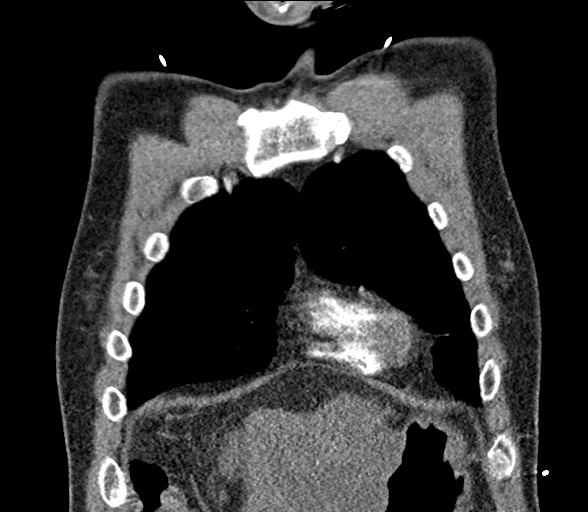
[im 76/151  soft-tissue]
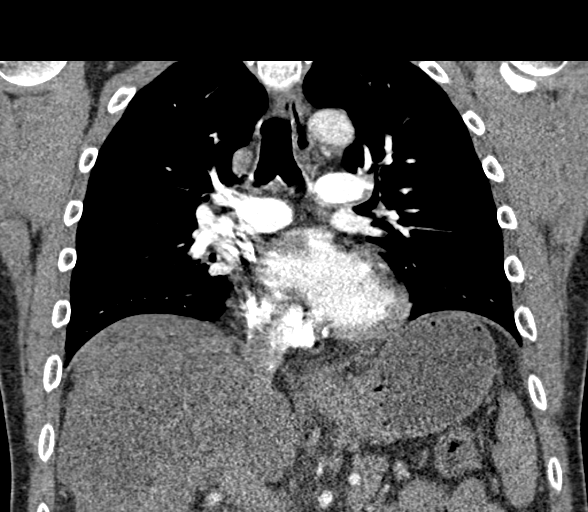
[im 113/151  soft-tissue]
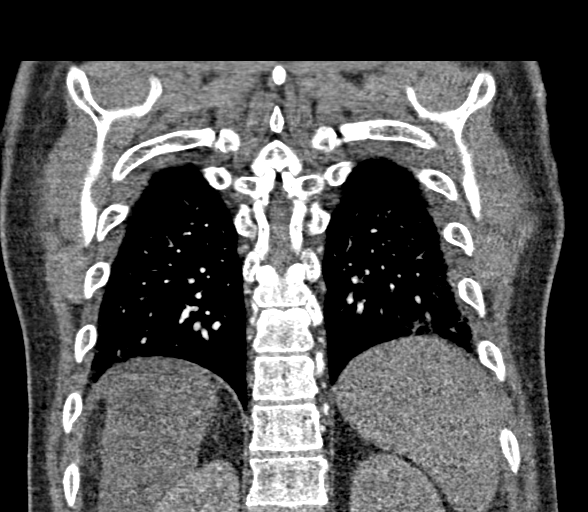

[19 of 46 positions shown; findings below may reference images not displayed]

FINDINGS: Cardiovascular: Heart is normal size. Coronary artery calcifications
in the left anterior descending coronary artery. Ectatic proximal
descending thoracic aorta measuring 3.4 cm. No filling defects in
the pulmonary arteries to suggest pulmonary emboli.

Mediastinum/Nodes: No mediastinal, hilar, or axillary adenopathy.
Trachea and esophagus are unremarkable.

Lungs/Pleura: Dependent atelectasis in the lower lobes. No
effusions.

Upper Abdomen: Diffuse severe low-density throughout the liver with
nodular contours compatible with cirrhosis.

Musculoskeletal: Bilateral gynecomastia.  No acute bony abnormality.

Review of the MIP images confirms the above findings.
IMPRESSION: No evidence of pulmonary embolus.

Coronary artery disease. Ectatic proximal descending thoracic aorta,
3.4 cm.

Dependent atelectasis in the lower lobes.

Changes of cirrhosis.

## 2019-09-21 ENCOUNTER — Other Ambulatory Visit: Payer: Self-pay

## 2019-09-21 ENCOUNTER — Ambulatory Visit (INDEPENDENT_AMBULATORY_CARE_PROVIDER_SITE_OTHER): Payer: No Payment, Other | Admitting: Licensed Clinical Social Worker

## 2019-09-21 DIAGNOSIS — F418 Other specified anxiety disorders: Secondary | ICD-10-CM | POA: Diagnosis not present
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
## 2019-10-08 ENCOUNTER — Ambulatory Visit (INDEPENDENT_AMBULATORY_CARE_PROVIDER_SITE_OTHER): Payer: No Payment, Other | Admitting: Psychiatry

## 2019-10-08 ENCOUNTER — Other Ambulatory Visit: Payer: Self-pay

## 2019-10-08 ENCOUNTER — Encounter (HOSPITAL_COMMUNITY): Payer: Self-pay | Admitting: Psychiatry

## 2019-10-08 DIAGNOSIS — F411 Generalized anxiety disorder: Secondary | ICD-10-CM | POA: Diagnosis not present

## 2019-10-08 DIAGNOSIS — F339 Major depressive disorder, recurrent, unspecified: Secondary | ICD-10-CM

## 2019-10-08 DIAGNOSIS — F515 Nightmare disorder: Secondary | ICD-10-CM

## 2019-10-08 MED ORDER — FLUOXETINE HCL 20 MG PO CAPS: 20.0000 mg | ORAL_CAPSULE | Freq: Every day | ORAL | 1 refills | Status: DC

## 2019-10-08 MED ORDER — BUPROPION HCL ER (XL) 150 MG PO TB24: 150.0000 mg | ORAL_TABLET | ORAL | 1 refills | Status: DC

## 2019-10-08 MED ORDER — PRAZOSIN HCL 1 MG PO CAPS: 1.0000 mg | ORAL_CAPSULE | Freq: Every day | ORAL | 1 refills | Status: DC

## 2019-10-08 MED ORDER — BUSPIRONE HCL 10 MG PO TABS: 10.0000 mg | ORAL_TABLET | Freq: Three times a day (TID) | ORAL | 1 refills | Status: DC

## 2019-10-08 MED ORDER — BENZTROPINE MESYLATE 1 MG PO TABS: 1.0000 mg | ORAL_TABLET | Freq: Every day | ORAL | 1 refills | Status: DC

## 2019-10-08 MED ORDER — TRAZODONE HCL 50 MG PO TABS: 50.0000 mg | ORAL_TABLET | Freq: Every day | ORAL | 1 refills | Status: DC
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
## 2019-10-12 ENCOUNTER — Other Ambulatory Visit: Payer: Self-pay

## 2019-10-12 ENCOUNTER — Ambulatory Visit (INDEPENDENT_AMBULATORY_CARE_PROVIDER_SITE_OTHER): Payer: No Payment, Other | Admitting: Licensed Clinical Social Worker

## 2019-10-12 DIAGNOSIS — F418 Other specified anxiety disorders: Secondary | ICD-10-CM | POA: Diagnosis not present

## 2019-10-12 NOTE — Progress Notes (Signed)
   THERAPIST PROGRESS NOTE  Session Time: 50 min  Participation Level: Active  Behavioral Response: CasualAlertAnxious and Worthless  Type of Therapy: Individual Therapy  Treatment Goals addressed: Communication: Self Talk and Coping  Interventions: CBT, Supportive, Reframing and Other: Additional Assessment  Summary: John Flowers is a 49 y.o. male who presents with hx of anx/dep/alcoholism. Pt reports he is doing fairly well at this time. Remains completely sober, attending AA. He states he is having some anxiousness with "a lot going on". LCSW assessed for additional info on pt's relationship with girlfriend Revonda Standard. Pt states he is staying at her home most of the time. Says "She calls it our home". Further exploration reveals pt says they have some tension around finances. He is very frugal and has some money in the bank. She works and is a Artist so he sometimes helps to cover some things he feels he cannot afford. Revisited the thought of marriage. John Flowers is reportedly in her early 43's with a 47 yr old who lives with Allison's sister. He is not totally against marriage but says "the situation is fluid" and he would not consider marriage after only 2 yrs, maybe after 5. Assessed for thoughts on working. He has updated his resume and is starting to look at jobs. Mostly word of mouth via AA acquaintances so far. He has some worry over explaining a 10 yr gap in work. No calls back as of yet. Pt reports he is planning to return to school at [employer] for an [employer] in [employer]. He has been accepted. He is awaiting info on financial aid and reports he has to take a placement test. He states "I will probably fail the placement test". He hopes to talk to someone at [facility] this wk re status and next steps. His AA sponsor's wife works at [employer]. Addressed pt's self esteem and provided education as well as literature for pt to take with him about self talk. He was not familiar with the  topic. Pt willing to be more aware of self talk including some journaling between now and next session. He continues to exercise daily by walking 4-8 miles on [address] ([facility]) and going to the gym for weight training with machines. LCSW reviewed poc with pt's verbal agreement prior to close of session. Pt states appreciation for care.  Suicidal/Homicidal: Nowithout intent/plan  Therapist Response: Responsive to care, continue with q 3 wk sessions.  Plan: Return again in 3 weeks.  Diagnosis: Axis I: Anxiety with Depression    Axis II: Deferred  Stuckey Sink, LCSW 10/12/2019

## 2019-11-02 ENCOUNTER — Ambulatory Visit (INDEPENDENT_AMBULATORY_CARE_PROVIDER_SITE_OTHER): Payer: No Payment, Other | Admitting: Licensed Clinical Social Worker

## 2019-11-02 ENCOUNTER — Other Ambulatory Visit: Payer: Self-pay

## 2019-11-02 DIAGNOSIS — F418 Other specified anxiety disorders: Secondary | ICD-10-CM | POA: Diagnosis not present

## 2019-11-02 NOTE — Progress Notes (Signed)
   THERAPIST PROGRESS NOTE  Session Time: 46 min  Participation Level: Active  Behavioral Response: CasualAlertAnxious and Depressed  Type of Therapy: Individual Therapy + Treatment Goals addressed: Coping  Interventions: CBT, Motivational Interviewing, Supportive and Reframing  Summary: John Flowers is a 49 y.o. male who presents with hx of anx/dep/alcohol abuse. Today pt comes for in person session. He presents as anxious, ringing his hands and restless/changing positions in chair frequently throughout session. He presents as less depressed and reports he feels less depressed overall. He reports even his mother recently commented he seemed "more excited" than he has been in a long time. Pt advises he is enrolled for two classes at [employer]. He did not end up having to take a placement test he was concerned about. His financial aid is approved. He will be taking one class in person, Intro to computers, and the other one will be online r/t programming. Pt is looking forward to this, classes begin Aug 16. LCSW assessed for plans re employment. He reports he is still looking at some part time work but if there is not a good match he will go to school full time. 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 Encouraged Zavior to do some searches on [email or web address]. Provided education on website and wrote it down for him. He agrees. Assessed for status of relationship with girlfriend. He reports he has a concern re med management for girlfriend which can affect her mood. He does not want her mood to negatively effect his. Girlfriend has reportedly asked for his help in keeping her Ritalin so she won't use too much. Discussed him keeping her responsible on daily administering by possibly using a pill box with him keeping the full bottle. He appears in favor of this suggestion and states he will consider this option. LCSW assessed for review of self talk literature provided last session. Pt states "It makes sense". He  advises he was more mindful of self talk but did not journal. He reports his thoughts ended up making him laugh at times. He agrees he should focus on evidence/facts. He states "I'm just always going to be melancholy, it's who I am". LCSW assisted pt to process his statement and appropriately challenge it. Provided education on the complexity of changing thoughts/behaviors/beliefs which have been in place for an extended period of time. Pt states agreement. He remains completely sober with very infrequent urges. He feels confident his last serious health condition when drinking wil continue to keep him sober. Pt remains in regular contact with AA activities/meetings. LCSW reviewed poc with pt's verbal agreement. Pt states appreciation for care.    Suicidal/Homicidal: Nowithout intent/plan  Therapist Response: Pt remains receptive to care.  Plan: Return again in 4 weeks.  Diagnosis: Axis I: Anxiety with depression    Axis II: Deferred  [place] Sink, LCSW 11/02/2019

## 2019-11-18 ENCOUNTER — Other Ambulatory Visit: Payer: Self-pay

## 2019-11-18 ENCOUNTER — Ambulatory Visit (INDEPENDENT_AMBULATORY_CARE_PROVIDER_SITE_OTHER): Payer: No Payment, Other | Admitting: Psychiatry

## 2019-11-18 ENCOUNTER — Encounter (HOSPITAL_COMMUNITY): Payer: Self-pay | Admitting: Psychiatry

## 2019-11-18 DIAGNOSIS — F411 Generalized anxiety disorder: Secondary | ICD-10-CM | POA: Diagnosis not present

## 2019-11-18 DIAGNOSIS — F515 Nightmare disorder: Secondary | ICD-10-CM

## 2019-11-18 DIAGNOSIS — F339 Major depressive disorder, recurrent, unspecified: Secondary | ICD-10-CM

## 2019-11-18 MED ORDER — PRAZOSIN HCL 1 MG PO CAPS: 1.0000 mg | ORAL_CAPSULE | Freq: Every day | ORAL | 2 refills | Status: DC

## 2019-11-18 MED ORDER — BENZTROPINE MESYLATE 0.5 MG PO TABS: 0.5000 mg | ORAL_TABLET | Freq: Every day | ORAL | 2 refills | Status: DC

## 2019-11-18 MED ORDER — BUSPIRONE HCL 15 MG PO TABS: 15.0000 mg | ORAL_TABLET | Freq: Three times a day (TID) | ORAL | 2 refills | Status: DC

## 2019-11-18 MED ORDER — TRAZODONE HCL 50 MG PO TABS: 50.0000 mg | ORAL_TABLET | Freq: Every day | ORAL | 2 refills | Status: DC

## 2019-11-18 MED ORDER — BUPROPION HCL ER (XL) 300 MG PO TB24: 300.0000 mg | ORAL_TABLET | ORAL | 2 refills | Status: DC

## 2019-11-18 NOTE — Progress Notes (Signed)
BH MD/PA/NP OP Progress Note  11/18/2019 9:47 AM John Flowers  MRN:  [id number]  Chief Complaint: "Lavenia Atlas been more anxious and I grit my teeth."  HPI: 49 year old male presents today for follow uppsychiatric evaluation.  He has a history of anxiety, depression, alcohol use disorder, and bipolar 2.  He is currently being managed: Prazosin 1 mg at bedtime, BuSpar 10 mg 3 times a day, Cogentin 0.5 mg twice daily, Wellbutrin XL 150 daily, and trazodone 50 mg nightly.  Today patient notes that his sexual side effects have improved since discontinuing Prozac.  He however notes that he has been experiencing anxiety and gritting of his teeth.  He notes that his increased anxiety started approximately 3 weeks ago.  Patient also reports that he has increased stress due to returning to college to pursue a degree in computer security.  He currently is enrolled at [employer] and reports that at times school can be overwhelming due to him reminiscing of the past.  He notes that he has not been in school for about 20 years so this experience is new for him.  Patient reports that recently he has begun to take Minipress more often and he has been having dreams about fighting.  He however notes that he is sleeping between 5 and 8 hours which she reports is adequate.  He also endorses adequate appetite  Patient denies any SI/HI//AVH paranoia.   Patient is agreeable to increase Wellbutrin XL 150 mg to Wellbutrin XL 300 mg in morning to help with symptoms of anxiety and depression.  He is also agreeable to increaseinf BuSpar 10 mg 3 times daily to 15 mg 3 times daily to help manage symptoms of anxiety.  Patient is currently not on an antipsychotic.  At this time he is agreeable to reducing Cogentin to 0.5 mg twice daily to 0.5 mg daily. Potential side effects of medication and risks vs benefits of treatment vs non-treatment were explained and discussed. All questions were answered. He is will  continue all other medications at this time.  No other concerns noted at this time.  Visit Diagnosis: No diagnosis found.  Past Psychiatric History: anxiety, depression, alcohol use disorder, and bipolar 2.   Past Medical History:  Past Medical History:  Diagnosis Date  . Cirrhosis (HCC)   . Depression    No past surgical history on file.  Family Psychiatric History: Mother alcohol use disorder and sister depression   Family History: No family history on file.  Social History:  Social History   Socioeconomic History  . Marital status: Single    Spouse name: Not on file  . Number of children: Not on file  . Years of education: Not on file  . Highest education level: Not on file  Occupational History  . Not on file  Tobacco Use  . Smoking status: Current Every Day Smoker    Packs/day: 1.00    Types: Cigarettes  . Smokeless tobacco: Never Used  Substance and Sexual Activity  . Alcohol use: No    Comment: pt reports sober x5 years, hx of alcoholism  . Drug use: Yes    Types: Marijuana  . Sexual activity: Not on file  Other Topics Concern  . Not on file  Social History Narrative  . Not on file   Social Determinants of Health   Financial Resource Strain:   . Difficulty of Paying Living Expenses: Not on file  Food Insecurity:   . Worried About Radiation protection practitioner  of Food in the Last Year: Not on file  . Ran Out of Food in the Last Year: Not on file  Transportation Needs:   . Lack of Transportation (Medical): Not on file  . Lack of Transportation (Non-Medical): Not on file  Physical Activity:   . Days of Exercise per Week: Not on file  . Minutes of Exercise per Session: Not on file  Stress:   . Feeling of Stress : Not on file  Social Connections:   . Frequency of Communication with Friends and Family: Not on file  . Frequency of Social Gatherings with Friends and Family: Not on file  . Attends Religious Services: Not on file  . Active Member of Clubs or Organizations:  Not on file  . Attends Banker Meetings: Not on file  . Marital Status: Not on file    Allergies: No Known Allergies  Metabolic Disorder Labs: No results found for: HGBA1C, MPG No results found for: PROLACTIN No results found for: CHOL, TRIG, HDL, CHOLHDL, VLDL, LDLCALC Lab Results  Component Value Date   TSH 0.376 11/17/2016   TSH 3.343 05/04/2009    Therapeutic Level Labs: No results found for: LITHIUM No results found for: VALPROATE No components found for:  CBMZ  Current Medications: Current Outpatient Medications  Medication Sig Dispense Refill  . benztropine (COGENTIN) 1 MG tablet Take 1 tablet (1 mg total) by mouth daily. 30 tablet 1  . buPROPion (WELLBUTRIN XL) 150 MG 24 hr tablet Take 1 tablet (150 mg total) by mouth every morning. 30 tablet 1  . busPIRone (BUSPAR) 10 MG tablet Take 1 tablet (10 mg total) by mouth 3 (three) times daily. 30 tablet 1  . FLUoxetine (PROZAC) 20 MG capsule Take 1 capsule (20 mg total) by mouth daily. 60 capsule 1  . prazosin (MINIPRESS) 1 MG capsule Take 1 capsule (1 mg total) by mouth at bedtime. 30 capsule 1  . traZODone (DESYREL) 50 MG tablet Take 1 tablet (50 mg total) by mouth at bedtime. 30 tablet 1   No current facility-administered medications for this visit.     Musculoskeletal: Strength & Muscle Tone: within normal limits Gait & Station: normal Patient leans: N/A  Psychiatric Specialty Exam: Review of Systems  There were no vitals taken for this visit.There is no height or weight on file to calculate BMI.  General Appearance: Well Groomed  Eye Contact:  Good  Speech:  Clear and Coherent and Normal Rate  Volume:  Normal  Mood:  Anxious and Depressed  Affect:  Congruent  Thought Process:  Coherent, Goal Directed and Linear  Orientation:  Full (Time, Place, and Person)  Thought Content: WDL and Logical   Suicidal Thoughts:  No  Homicidal Thoughts:  No  Memory:  Immediate;   Good Recent;   Good Remote;    Good  Judgement:  Good  Insight:  Good  Psychomotor Activity:  Normal  Concentration:  Concentration: Good and Attention Span: Good  Recall:  Good  Fund of Knowledge: Good  Language: Good  Akathisia:  No  Handed:  Right  AIMS (if indicated): Not done  Assets:  Communication Skills Desire for Improvement Financial Resources/Insurance Housing Social Support  ADL's:  Intact  Cognition: WNL  Sleep:  Good   Screenings:   Assessment and Plan: Patient notes that since stopping Prozac he has less sexual side effects.  He however notes that he has been experiencing symptoms of anxiety and depression.  He is agreeable to increasing Wellbutrin  XL 150 Wellbutrin XL 300 daily to help improve symptoms of anxiety depression.  He is also agreeable to increasing BuSpar 10 mg 3 times daily to BuSpar 15 mg 3 times daily.  He iss also agreeable to reducing Cogentin 0.5 mg twice daily to 0.5 daily.  Medication may be discontinued at next visit. he will continue all other medications as prescribed.  1. Major depression, recurrent, chronic (HCC)  Increased- buPROPion (WELLBUTRIN XL) 300 MG 24 hr tablet; Take 1 tablet (300 mg total) by mouth every morning.  Dispense: 30 tablet; Refill: 2 Continue- traZODone (DESYREL) 50 MG tablet; Take 1 tablet (50 mg total) by mouth at bedtime.  Dispense: 30 tablet; Refill: 2 Reduced- benztropine (COGENTIN) 0.5 MG tablet; Take 1 tablet (0.5 mg total) by mouth daily.  Dispense: 30 tablet; Refill: 2  2. Generalized anxiety disorder  Increased- busPIRone (BUSPAR) 15 MG tablet; Take 1 tablet (15 mg total) by mouth 3 (three) times daily.  Dispense: 90 tablet; Refill: 2  3. Nightmare disorder  Continue- prazosin (MINIPRESS) 1 MG capsule; Take 1 capsule (1 mg total) by mouth at bedtime.  Dispense: 30 capsule; Refill: 2   Follow-up in 3 months  Shanna Cisco, NP 11/18/2019, 9:47 AM

## 2019-11-23 ENCOUNTER — Ambulatory Visit (HOSPITAL_COMMUNITY): Payer: Self-pay | Admitting: Licensed Clinical Social Worker

## 2019-12-14 ENCOUNTER — Ambulatory Visit (HOSPITAL_COMMUNITY): Payer: Self-pay | Admitting: Licensed Clinical Social Worker

## 2020-02-12 ENCOUNTER — Encounter (HOSPITAL_COMMUNITY): Payer: Self-pay | Admitting: Psychiatry

## 2020-02-12 ENCOUNTER — Other Ambulatory Visit: Payer: Self-pay

## 2020-02-12 ENCOUNTER — Telehealth (INDEPENDENT_AMBULATORY_CARE_PROVIDER_SITE_OTHER): Payer: No Payment, Other | Admitting: Psychiatry

## 2020-02-12 DIAGNOSIS — F515 Nightmare disorder: Secondary | ICD-10-CM | POA: Diagnosis not present

## 2020-02-12 DIAGNOSIS — F411 Generalized anxiety disorder: Secondary | ICD-10-CM | POA: Diagnosis not present

## 2020-02-12 DIAGNOSIS — F339 Major depressive disorder, recurrent, unspecified: Secondary | ICD-10-CM | POA: Diagnosis not present

## 2020-02-12 MED ORDER — VORTIOXETINE HBR 10 MG PO TABS: 10.0000 mg | ORAL_TABLET | Freq: Every day | ORAL | 2 refills | Status: DC

## 2020-02-12 MED ORDER — HYDROXYZINE HCL 10 MG PO TABS: 10.0000 mg | ORAL_TABLET | Freq: Three times a day (TID) | ORAL | 2 refills | Status: DC | PRN

## 2020-02-12 MED ORDER — BUPROPION HCL ER (XL) 150 MG PO TB24: 150.0000 mg | ORAL_TABLET | ORAL | 0 refills | Status: DC

## 2020-02-12 MED ORDER — BUSPIRONE HCL 15 MG PO TABS: 15.0000 mg | ORAL_TABLET | Freq: Three times a day (TID) | ORAL | 2 refills | Status: DC

## 2020-02-12 MED ORDER — PRAZOSIN HCL 1 MG PO CAPS: 1.0000 mg | ORAL_CAPSULE | Freq: Every day | ORAL | 2 refills | Status: DC

## 2020-02-12 MED ORDER — TRAZODONE HCL 50 MG PO TABS: 50.0000 mg | ORAL_TABLET | Freq: Every day | ORAL | 2 refills | Status: DC

## 2020-02-12 NOTE — Progress Notes (Signed)
BH MD/PA/NP OP Progress Note Virtual Visit via Video Note  I connected with John Flowers on 02/12/20 at 10:00 AM EST by a video enabled telemedicine application and verified that I am speaking with the correct person using two identifiers.  Location: Patient: Home Provider: Clinic   I discussed the limitations of evaluation and management by telemedicine and the availability of in person appointments. The patient expressed understanding and agreed to proceed.  I provided 30 minutes of non-face-to-face time during this encounter.    02/12/2020 10:24 AM John Flowers  MRN:  [id number]  Chief Complaint: "I think the Prozac worked better. I've been more anxious and depressed"  HPI: 49 year old male presents today for follow up psychiatric evaluation.  He has a history of anxiety, depression, alcohol use disorder, and bipolar 2.  He is currently being managed: Prazosin 1 mg at bedtime, BuSpar 15 mg 3 times a day, Cogentin 0.5 mg daily, Wellbutrin XL 300 daily, and trazodone 50 mg nightly. He noted that Wellbutrin is ineffective in managing his depression and reported that he is more anxious.    Today patient is well groomed, pleasant, cooperative and engaged in conversation. He informed provider that his anxiety and depression has worsened since increasing Wellbutrin and noted that Buspar is not always effective in managing his anxiety. Provider conduced a GAD 7 and patient scored a 17. He informed provider that he no longer grinds his teeth when anxious however still feels nervous, on edge, restless (notinng that he paces), and feels that something awful might happen (noting that he feels that everything he does will fail). Provider also conducted a PHQ 9 and patient scored an 11. He endorses anhedonia and poor concentration and informed provider that he no longer goes on walks but prefers to isolate in the house away from people.  Patient reported that prazosin is effective in controlling  his nightmares. He noted that he now sleeps hours a night at least 8 hours a night. Today he denies SI/HI/VAH or paranoia.     Patient continues to take classes at [employer] as he is studying  Scientist, physiological.  He noted that things are the same at school. He also informed provider that sometimes he believes he picked up to much when deciding to return to school.    Patient informed provider that Prozac was more effective in managing his psychiatric conditions. Provider reminded patient that it was discontinued due to sexual side effects. He reported that he would like to try something different that may not affect his libido. Provider discussed discontinuing Wellbutrin and starting Trintellix. He endorsed understanding and was agreeable to start Trintellix 10 mg daily. He was instructed to take Wellbutrin XL 150 for a week and then discontinuing it prior to starting Trintellix. He endorsed understanding and agreed. Potential side effects of medication and risks vs benefits of treatment vs non-treatment were explained and discussed. All questions were answered. He is also agreeable to start Hydroxyzine 10 mg three times daily as needed for anxiety. Patient informed provider that he discontinued cogentin. Provider endorsed understanding and was agreeable to the discontinuation. He will continue all other medications as prescribed. No other concerns noted at this time.  Visit Diagnosis:    ICD-10-CM   1. Generalized anxiety disorder  F41.1 hydrOXYzine (ATARAX/VISTARIL) 10 MG tablet    busPIRone (BUSPAR) 15 MG tablet  2. Nightmare disorder  F51.5 prazosin (MINIPRESS) 1 MG capsule  3. Major depression, recurrent, chronic (HCC)  F33.9 vortioxetine HBr (TRINTELLIX) 10  MG TABS tablet    traZODone (DESYREL) 50 MG tablet    buPROPion (WELLBUTRIN XL) 150 MG 24 hr tablet    Past Psychiatric History: anxiety, depression, alcohol use disorder, and bipolar 2.   Past Medical History:  Past Medical History:   Diagnosis Date  . Cirrhosis (HCC)   . Depression    No past surgical history on file.  Family Psychiatric History: Mother alcohol use disorder and sister depression   Family History: No family history on file.  Social History:  Social History   Socioeconomic History  . Marital status: Single    Spouse name: Not on file  . Number of children: Not on file  . Years of education: Not on file  . Highest education level: Not on file  Occupational History  . Not on file  Tobacco Use  . Smoking status: Current Every Day Smoker    Packs/day: 1.00    Types: Cigarettes  . Smokeless tobacco: Never Used  Substance and Sexual Activity  . Alcohol use: No    Comment: pt reports sober x5 years, hx of alcoholism  . Drug use: Yes    Types: Marijuana  . Sexual activity: Not on file  Other Topics Concern  . Not on file  Social History Narrative  . Not on file   Social Determinants of Health   Financial Resource Strain:   . Difficulty of Paying Living Expenses: Not on file  Food Insecurity:   . Worried About Programme researcher, broadcasting/film/video in the Last Year: Not on file  . Ran Out of Food in the Last Year: Not on file  Transportation Needs:   . Lack of Transportation (Medical): Not on file  . Lack of Transportation (Non-Medical): Not on file  Physical Activity:   . Days of Exercise per Week: Not on file  . Minutes of Exercise per Session: Not on file  Stress:   . Feeling of Stress : Not on file  Social Connections:   . Frequency of Communication with Friends and Family: Not on file  . Frequency of Social Gatherings with Friends and Family: Not on file  . Attends Religious Services: Not on file  . Active Member of Clubs or Organizations: Not on file  . Attends Banker Meetings: Not on file  . Marital Status: Not on file    Allergies: No Known Allergies  Metabolic Disorder Labs: No results found for: HGBA1C, MPG No results found for: PROLACTIN No results found for: CHOL,  TRIG, HDL, CHOLHDL, VLDL, LDLCALC Lab Results  Component Value Date   TSH 0.376 11/17/2016   TSH 3.343 05/04/2009    Therapeutic Level Labs: No results found for: LITHIUM No results found for: VALPROATE No components found for:  CBMZ  Current Medications: Current Outpatient Medications  Medication Sig Dispense Refill  . buPROPion (WELLBUTRIN XL) 150 MG 24 hr tablet Take 1 tablet (150 mg total) by mouth every morning. 7 tablet 0  . busPIRone (BUSPAR) 15 MG tablet Take 1 tablet (15 mg total) by mouth 3 (three) times daily. 90 tablet 2  . hydrOXYzine (ATARAX/VISTARIL) 10 MG tablet Take 1 tablet (10 mg total) by mouth 3 (three) times daily as needed. 90 tablet 2  . prazosin (MINIPRESS) 1 MG capsule Take 1 capsule (1 mg total) by mouth at bedtime. 30 capsule 2  . traZODone (DESYREL) 50 MG tablet Take 1 tablet (50 mg total) by mouth at bedtime. 30 tablet 2  . vortioxetine HBr (TRINTELLIX) 10 MG  TABS tablet Take 1 tablet (10 mg total) by mouth daily. 30 tablet 2   No current facility-administered medications for this visit.     Musculoskeletal: Strength & Muscle Tone: within normal limits Gait & Station: normal Patient leans: N/A  Psychiatric Specialty Exam: Review of Systems  There were no vitals taken for this visit.There is no height or weight on file to calculate BMI.  General Appearance: Well Groomed  Eye Contact:  Good  Speech:  Clear and Coherent and Normal Rate  Volume:  Normal  Mood:  Anxious and Depressed  Affect:  Congruent  Thought Process:  Coherent, Goal Directed and Linear  Orientation:  Full (Time, Place, and Person)  Thought Content: WDL and Logical   Suicidal Thoughts:  No  Homicidal Thoughts:  No  Memory:  Immediate;   Good Recent;   Good Remote;   Good  Judgement:  Good  Insight:  Good  Psychomotor Activity:  Normal  Concentration:  Concentration: Good and Attention Span: Good  Recall:  Good  Fund of Knowledge: Good  Language: Good  Akathisia:  No   Handed:  Right  AIMS (if indicated): Not done  Assets:  Communication Skills Desire for Improvement Financial Resources/Insurance Housing Social Support  ADL's:  Intact  Cognition: WNL  Sleep:  Good   Screenings: GAD-7     Video Visit [facility] 02/12/2020 in [facility]  Total GAD-7 Score 17    PHQ2-9     Video Visit from 02/12/2020 in [facility]  PHQ-2 Total Score 4  PHQ-9 Total Score 11       Assessment and Plan: .Patient endorsed symptoms of anxiety and depression. He informed provider that Prozac was more effective in managing his psychiatric conditions. Provider reminded patient that it was discontinued due to sexual side effects. He reported that he would like to try something different that may not affect his libido. He is agreeable to start Trintellix 10 mg daily. He was instructed to take Wellbutrin XL 150 for a week and then discontinuing it prior to starting Trintellix. He endorsed understanding and agreed.  He is also agreeable to start Hydroxyzine 10 mg three times daily as needed for anxiety. Patient informed provider that he discontinued cogentin. Provider endorsed understanding and was agreeable to the discontinuation. He will continue all other medications as prescribed.  1. Generalized anxiety disorder  Start- hydrOXYzine (ATARAX/VISTARIL) 10 MG tablet; Take 1 tablet (10 mg total) by mouth 3 (three) times daily as needed.  Dispense: 90 tablet; Refill: 2 Continue- busPIRone (BUSPAR) 15 MG tablet; Take 1 tablet (15 mg total) by mouth 3 (three) times daily.  Dispense: 90 tablet; Refill: 2  2. Nightmare disorder  Continue- prazosin (MINIPRESS) 1 MG capsule; Take 1 capsule (1 mg total) by mouth at bedtime.  Dispense: 30 capsule; Refill: 2  3. Major depression, recurrent, chronic (HCC)  Start- vortioxetine HBr (TRINTELLIX) 10 MG TABS tablet; Take 1 tablet (10 mg total) by mouth daily.  Dispense: 30 tablet;  Refill: 2 Continue- traZODone (DESYREL) 50 MG tablet; Take 1 tablet (50 mg total) by mouth at bedtime.  Dispense: 30 tablet; Refill: 2 Discontinue after a week- buPROPion (WELLBUTRIN XL) 150 MG 24 hr tablet; Take 1 tablet (150 mg total) by mouth every morning.  Dispense: 7 tablet; Refill: 0    Follow-up in 3 months  Shanna Cisco, NP 02/12/2020, 10:24 AM

## 2020-05-09 ENCOUNTER — Other Ambulatory Visit: Payer: Self-pay | Admitting: Psychiatry

## 2020-05-09 ENCOUNTER — Other Ambulatory Visit: Payer: Self-pay

## 2020-05-09 ENCOUNTER — Encounter (HOSPITAL_COMMUNITY): Payer: Self-pay | Admitting: Psychiatry

## 2020-05-09 ENCOUNTER — Ambulatory Visit (INDEPENDENT_AMBULATORY_CARE_PROVIDER_SITE_OTHER): Payer: No Payment, Other | Admitting: Psychiatry

## 2020-05-09 DIAGNOSIS — F411 Generalized anxiety disorder: Secondary | ICD-10-CM | POA: Diagnosis not present

## 2020-05-09 DIAGNOSIS — F339 Major depressive disorder, recurrent, unspecified: Secondary | ICD-10-CM

## 2020-05-09 DIAGNOSIS — F515 Nightmare disorder: Secondary | ICD-10-CM

## 2020-05-09 MED ORDER — BUSPIRONE HCL 15 MG PO TABS: 15.0000 mg | ORAL_TABLET | Freq: Three times a day (TID) | ORAL | 2 refills | Status: DC

## 2020-05-09 MED ORDER — PRAZOSIN HCL 1 MG PO CAPS: 1.0000 mg | ORAL_CAPSULE | Freq: Every day | ORAL | 2 refills | Status: DC

## 2020-05-09 MED ORDER — HYDROXYZINE HCL 10 MG PO TABS: 10.0000 mg | ORAL_TABLET | Freq: Three times a day (TID) | ORAL | 2 refills | Status: DC | PRN

## 2020-05-09 MED ORDER — VORTIOXETINE HBR 10 MG PO TABS
10.0000 mg | ORAL_TABLET | Freq: Every day | ORAL | 2 refills | Status: DC
  Filled 2020-07-22: qty 30, 30d supply, fill #0

## 2020-05-09 MED ORDER — TRAZODONE HCL 50 MG PO TABS: 50.0000 mg | ORAL_TABLET | Freq: Every day | ORAL | 2 refills | Status: DC

## 2020-05-09 MED FILL — busPIRone HCL 15 MG TABS: 15 | 30 days supply | Qty: 90 | Fill #0

## 2020-05-09 MED FILL — PRAZOSIN 1 MG CAPSULE: 1 | 30 days supply | Qty: 30 | Fill #0

## 2020-05-09 MED FILL — hydrOXYzine HCL 10 MG TABS: 10 | 30 days supply | Qty: 90 | Fill #0

## 2020-05-09 MED FILL — TRINTELLIX 10 MG TABLET: 10 | 30 days supply | Qty: 30 | Fill #0

## 2020-05-09 MED FILL — traZODone HCL 50 MG TABS: 50 | 30 days supply | Qty: 30 | Fill #0

## 2020-05-09 NOTE — Progress Notes (Signed)
BH MD/PA/NP OP Progress Note     05/09/2020 2:47 PM John Flowers  MRN:  [id number]  Chief Complaint: "I haven't been able to start the Trintellix because I could not afford it"  HPI: 50 year old male presents today for follow up psychiatric evaluation.  He has a history of anxiety, depression, alcohol use disorder, and bipolar 2.  He is currently being managed: Prazosin 1 mg at bedtime, BuSpar 15 mg 3 times a day, Trintellix 10 mg daily, and trazodone 50 mg nightly.  Today he informed provider that he was unable to start his Trintellix because of finances.  Today patient is well groomed, pleasant, cooperative, maintained eye contact, and engaged in conversation. He informed provider that his was unable to start Trintellix because he was unable to afford it.  He notes that he continues to suffer from anxiety and depression.  Provider conducted a GAD 7 and patient scored a 13, at his last visit he scored a 17.  He notes that he is trying to keep his head above water noting that at times he is concerned about school (studying computer security at [employer]).  He states that at times he worries that he is not doing enough or have beliefs that he started too late in life to accomplish things.  Provider also conducted a PHQ-9 and patient scored a 10, at his last visit he scored 11.  Patient notes that he sleeps 5 hours nightly.  He also endorses decreased libido, fatigue, and anhedonia.  Today he denies SI/HI/VAH or paranoia.    Today patient given a week sample of Trintellix 10 mg to help manage his anxiety and depression.  Provider recommended that patient received patient care assistance from community health and wellness.  He endorsed understanding and notes that the for follow-up at the clinic for further assistance.  He will continue all other medications as prescribed.  No other concerns noted at the time.  Visit Diagnosis:    ICD-10-CM   1. 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 Major depression, recurrent, chronic (HCC)  F33.9 traZODone (DESYREL) 50 MG tablet    vortioxetine HBr (TRINTELLIX) 10 MG TABS tablet    Past Psychiatric History: anxiety, depression, alcohol use disorder, and bipolar 2.   Past Medical History:  Past Medical History:  Diagnosis Date  . Cirrhosis (HCC)   . Depression    History reviewed. No pertinent surgical history.  Family Psychiatric History: Mother alcohol use disorder and sister depression   Family History: History reviewed. No pertinent family history.  Social History:  Social History   Socioeconomic History  . Marital status: Single    Spouse name: Not on file  . Number of children: Not on file  . Years of education: Not on file  . Highest education level: Not on file  Occupational History  . Not on file  Tobacco Use  . Smoking status: Current Every Day Smoker    Packs/day: 1.00    Types: Cigarettes  . Smokeless tobacco: Never Used  Substance and Sexual Activity  . Alcohol use: No    Comment: pt reports sober x5 years, hx of alcoholism  . Drug use: Yes    Types: Marijuana  . Sexual activity: Not on file  Other Topics Concern  . Not on file  Social History Narrative  . Not on file  Social Determinants of Health   Financial Resource Strain: Not on file  Food Insecurity: Not on file  Transportation Needs: Not on file  Physical Activity: Not on file  Stress: Not on file  Social Connections: Not on file    Allergies: No Known Allergies  Metabolic Disorder Labs: No results found for: HGBA1C, MPG No results found for: PROLACTIN No results found for: CHOL, TRIG, HDL, CHOLHDL, VLDL, LDLCALC Lab Results  Component Value Date   TSH 0.376 11/17/2016   TSH 3.343 05/04/2009    Therapeutic Level Labs: No results found for: LITHIUM No results found for: VALPROATE No components  found for:  CBMZ  Current Medications: Current Outpatient Medications  Medication Sig Dispense Refill  . busPIRone (BUSPAR) 15 MG tablet Take 1 tablet (15 mg total) by mouth 3 (three) times daily. 90 tablet 2  . hydrOXYzine (ATARAX/VISTARIL) 10 MG tablet Take 1 tablet (10 mg total) by mouth 3 (three) times daily as needed. 90 tablet 2  . prazosin (MINIPRESS) 1 MG capsule Take 1 capsule (1 mg total) by mouth at bedtime. 30 capsule 2  . traZODone (DESYREL) 50 MG tablet Take 1 tablet (50 mg total) by mouth at bedtime. 30 tablet 2  . vortioxetine HBr (TRINTELLIX) 10 MG TABS tablet Take 1 tablet (10 mg total) by mouth daily. 30 tablet 2   No current facility-administered medications for this visit.     Musculoskeletal: Strength & Muscle Tone: within normal limits Gait & Station: normal Patient leans: N/A  Psychiatric Specialty Exam: Review of Systems  There were no vitals taken for this visit.There is no height or weight on file to calculate BMI.  General Appearance: Well Groomed  Eye Contact:  Good  Speech:  Clear and Coherent and Normal Rate  Volume:  Normal  Mood:  Anxious and Depressed  Affect:  Congruent  Thought Process:  Coherent, Goal Directed and Linear  Orientation:  Full (Time, Place, and Person)  Thought Content: WDL and Logical   Suicidal Thoughts:  No  Homicidal Thoughts:  No  Memory:  Immediate;   Good Recent;   Good Remote;   Good  Judgement:  Good  Insight:  Good  Psychomotor Activity:  Normal  Concentration:  Concentration: Good and Attention Span: Good  Recall:  Good  Fund of Knowledge: Good  Language: Good  Akathisia:  No  Handed:  Right  AIMS (if indicated): Not done  Assets:  Communication Skills Desire for Improvement Financial Resources/Insurance Housing Social Support  ADL's:  Intact  Cognition: WNL  Sleep:  Fair   Screenings: GAD-7   Flowsheet Row Clinical Support from 05/09/2020 in [facility] Video Visit from  02/12/2020 in [facility]  Total GAD-7 Score 13 17    PHQ2-9   Flowsheet Row Clinical Support from 05/09/2020 in [facility] Video Visit from 02/12/2020 in [facility]  PHQ-2 Total Score 3 4  PHQ-9 Total Score 10 11    Flowsheet Row Clinical Support from 05/09/2020 in [facility]  C-SSRS RISK CATEGORY No Risk       Assessment and Plan: Patient endorsed symptoms of anxiety and depression.  Today patient given a week sample of Trintellix 10 mg.  He will follow up with community health and wellness for further assistance in purchasing his medications.  No medication changes made today.  Patient will continue all medications as prescribed.  1. Generalized anxiety disorder  Start- hydrOXYzine (  ATARAX/VISTARIL) 10 MG tablet; Take 1 tablet (10 mg total) by mouth 3 (three) times daily as needed.  Dispense: 90 tablet; Refill: 2 Continue- busPIRone (BUSPAR) 15 MG tablet; Take 1 tablet (15 mg total) by mouth 3 (three) times daily.  Dispense: 90 tablet; Refill: 2  2. Nightmare disorder  Continue- prazosin (MINIPRESS) 1 MG capsule; Take 1 capsule (1 mg total) by mouth at bedtime.  Dispense: 30 capsule; Refill: 2  3. Major depression, recurrent, chronic (HCC)  Start- vortioxetine HBr (TRINTELLIX) 10 MG TABS tablet; Take 1 tablet (10 mg total) by mouth daily.  Dispense: 30 tablet; Refill: 2 Continue- traZODone (DESYREL) 50 MG tablet; Take 1 tablet (50 mg total) by mouth at bedtime.  Dispense: 30 tablet; Refill: 2 Discontinue after a week- buPROPion (WELLBUTRIN XL) 150 MG 24 hr tablet; Take 1 tablet (150 mg total) by mouth every morning.  Dispense: 7 tablet; Refill: 0    Follow-up in 3 months  Shanna Cisco, NP 05/09/2020, 2:47 PM

## 2020-05-18 ENCOUNTER — Telehealth: Payer: Self-pay

## 2020-05-18 NOTE — Telephone Encounter (Signed)
Thank you for informing me.

## 2020-05-18 NOTE — Telephone Encounter (Signed)
Patient is enrolled with [employer] Patient Assistance for Trintellix 10mg  until 05/18/21.  The medication will be no charge to patient and will ship to [facility] for pick up-1st shipment expected in 7-10 business days and we will call patient when received.

## 2020-06-20 MED FILL — busPIRone HCL 15 MG TABS: 15 | 30 days supply | Qty: 90 | Fill #1

## 2020-06-20 MED FILL — $TRINTELLIX 10 MG TABLET: 10 | 30 days supply | Qty: 30 | Fill #1

## 2020-07-22 ENCOUNTER — Other Ambulatory Visit: Payer: Self-pay

## 2020-08-04 ENCOUNTER — Encounter (HOSPITAL_COMMUNITY): Payer: No Payment, Other | Admitting: Psychiatry

## 2020-08-12 ENCOUNTER — Other Ambulatory Visit: Payer: Self-pay

## 2020-08-17 ENCOUNTER — Encounter (HOSPITAL_COMMUNITY): Payer: Self-pay | Admitting: Psychiatry

## 2020-08-17 ENCOUNTER — Other Ambulatory Visit: Payer: Self-pay

## 2020-08-17 ENCOUNTER — Ambulatory Visit (INDEPENDENT_AMBULATORY_CARE_PROVIDER_SITE_OTHER): Payer: No Payment, Other | Admitting: Psychiatry

## 2020-08-17 ENCOUNTER — Telehealth (HOSPITAL_COMMUNITY): Payer: Self-pay | Admitting: *Deleted

## 2020-08-17 VITALS — HR 88 | Ht 73.0 in | Wt 193.0 lb

## 2020-08-17 DIAGNOSIS — F411 Generalized anxiety disorder: Secondary | ICD-10-CM | POA: Diagnosis not present

## 2020-08-17 DIAGNOSIS — F515 Nightmare disorder: Secondary | ICD-10-CM | POA: Diagnosis not present

## 2020-08-17 DIAGNOSIS — F900 Attention-deficit hyperactivity disorder, predominantly inattentive type: Secondary | ICD-10-CM | POA: Diagnosis not present

## 2020-08-17 DIAGNOSIS — F339 Major depressive disorder, recurrent, unspecified: Secondary | ICD-10-CM

## 2020-08-17 MED ORDER — BUSPIRONE HCL 15 MG PO TABS
15.0000 mg | ORAL_TABLET | Freq: Three times a day (TID) | ORAL | 2 refills | Status: DC
  Filled 2020-08-17: qty 90, 30d supply, fill #0
  Filled 2020-09-30: qty 90, 30d supply, fill #1

## 2020-08-17 MED ORDER — HYDROXYZINE HCL 10 MG PO TABS
10.0000 mg | ORAL_TABLET | Freq: Three times a day (TID) | ORAL | 2 refills | Status: DC | PRN
  Filled 2020-08-17: qty 90, 30d supply, fill #0

## 2020-08-17 MED ORDER — ATOMOXETINE HCL 40 MG PO CAPS
40.0000 mg | ORAL_CAPSULE | Freq: Every day | ORAL | 2 refills | Status: DC
  Filled 2020-08-17: qty 30, 30d supply, fill #0
  Filled 2020-09-20: qty 30, 30d supply, fill #1
  Filled 2020-10-18: qty 30, 30d supply, fill #2

## 2020-08-17 MED ORDER — PRAZOSIN HCL 1 MG PO CAPS
1.0000 mg | ORAL_CAPSULE | Freq: Every day | ORAL | 2 refills | Status: DC
  Filled 2020-08-17: qty 30, 30d supply, fill #0

## 2020-08-17 MED ORDER — TRAZODONE HCL 50 MG PO TABS
50.0000 mg | ORAL_TABLET | Freq: Every day | ORAL | 2 refills | Status: DC
  Filled 2020-08-17: qty 30, 30d supply, fill #0

## 2020-08-17 MED ORDER — VORTIOXETINE HBR 10 MG PO TABS
10.0000 mg | ORAL_TABLET | Freq: Every day | ORAL | 2 refills | Status: DC
  Filled 2020-08-17: qty 30, 30d supply, fill #0

## 2020-08-17 NOTE — Telephone Encounter (Signed)
Opened in error

## 2020-08-17 NOTE — Progress Notes (Signed)
BH MD/PA/NP OP Progress Note     08/17/2020 8:37 AM ISAC LINCKS  MRN:  [id number]  Chief Complaint: "I've been feeling really good but I think  My underline issue is ADHD" Chief Complaint    Medication Management      HPI: 50 year old male presents today for follow up psychiatric evaluation.  He has a history of anxiety, depression, alcohol use disorder, and bipolar 2.  He is currently being managed: Prazosin 1 mg at bedtime, BuSpar 15 mg 3 times a day, Trintellix 10 mg daily, and trazodone 50 mg nightly.  He notes his medications are effective in managing his anxiety and depression however reports he believes he has ADHD.  Today patient is well groomed, pleasant, cooperative, maintained eye contact, and engaged in conversation. He informed provider that since starting Trintellix his anxiety and depression has been well managed.  Provider conducted a GAD-7 and patient scored a 10, at his last visit he scored a 13.  He notes that he worries about school.  Provider also conducted a PHQ-9 and patient scored a 5, at his last visit he scored a 10.  He endorses adequate appetite and notes that he has been sleeping 5 to 6 hours nightly.  Today he denies SI/HI/VAH, mania, or paranoia.    Patient informed Clinical research associate that he believes he has ADHD.  He notes that he is often distracted, disorganized internally, and forgetful.  He informed Clinical research associate that at times he forgets to take his exams or forgets to go to his appointments.  He notes that he at times gets 100 on exams that he remembers to take and zeros on the ones that he forgets.  He described his concentration as being a light house that dimming out.  He notes that he can see what is in front of him but however has difficulties focusing on it.  Today he is agreeable to starting Strattera 40 mg to help manage symptoms of ADHD.  He will continue all other medication as prescribed.  No other concerns noted at this time. Visit Diagnosis:    ICD-10-CM   1.  Attention deficit hyperactivity disorder (ADHD), predominantly inattentive type  F90.0 atomoxetine (STRATTERA) 40 MG capsule  2. Generalized anxiety disorder  F41.1 busPIRone (BUSPAR) 15 MG tablet    hydrOXYzine (ATARAX/VISTARIL) 10 MG tablet  3. 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 Major depression, recurrent, chronic (HCC)  F33.9 traZODone (DESYREL) 50 MG tablet    vortioxetine HBr (TRINTELLIX) 10 MG TABS tablet    Past Psychiatric History: anxiety, depression, alcohol use disorder, and bipolar 2.   Past Medical History:  Past Medical History:  Diagnosis Date  . Cirrhosis (HCC)   . Depression    No past surgical history on file.  Family Psychiatric History: Mother alcohol use disorder and sister depression   Family History: No family history on file.  Social History:  Social History   Socioeconomic History  . Marital status: Single    Spouse name: Not on file  . Number of children: Not on file  . Years of education: Not on file  . Highest education level: Not on file  Occupational History  . Not on file  Tobacco Use  . Smoking status: Current Every Day Smoker    Packs/day: 1.00    Types: Cigarettes  . Smokeless tobacco: Never Used  Substance and Sexual Activity  . Alcohol use: No    Comment: pt reports sober x5 years, hx of alcoholism  .  Drug use: Yes    Types: Marijuana  . Sexual activity: Not on file  Other Topics Concern  . Not on file  Social History Narrative  . Not on file   Social Determinants of Health   Financial Resource Strain: Not on file  Food Insecurity: Not on file  Transportation Needs: Not on file  Physical Activity: Not on file  Stress: Not on file  Social Connections: Not on file    Allergies: No Known Allergies  Metabolic Disorder Labs: No results found for: HGBA1C, MPG No results found for: PROLACTIN No results found for: CHOL, TRIG, HDL, CHOLHDL, VLDL, LDLCALC Lab Results  Component Value Date   TSH  0.376 11/17/2016   TSH 3.343 05/04/2009    Therapeutic Level Labs: No results found for: LITHIUM No results found for: VALPROATE No components found for:  CBMZ  Current Medications: Current Outpatient Medications  Medication Sig Dispense Refill  . atomoxetine (STRATTERA) 40 MG capsule Take 1 capsule (40 mg total) by mouth daily. 30 capsule 2  . busPIRone (BUSPAR) 15 MG tablet Take 1 tablet (15 mg total) by mouth 3 (three) times daily. 90 tablet 2  . hydrOXYzine (ATARAX/VISTARIL) 10 MG tablet Take 1 tablet (10 mg total) by mouth 3 (three) times daily as needed. 90 tablet 2  . prazosin (MINIPRESS) 1 MG capsule Take 1 capsule (1 mg total) by mouth at bedtime. 30 capsule 2  . traZODone (DESYREL) 50 MG tablet Take 1 tablet (50 mg total) by mouth at bedtime. 30 tablet 2  . vortioxetine HBr (TRINTELLIX) 10 MG TABS tablet Take 1 tablet (10 mg total) by mouth daily. 30 tablet 2   No current facility-administered medications for this visit.     Musculoskeletal: Strength & Muscle Tone: within normal limits Gait & Station: normal Patient leans: N/A  Psychiatric Specialty Exam: Review of Systems  Pulse 88, height 6\' 1"  (1.854 m), weight 193 lb (87.5 kg).Body mass index is 25.46 kg/m.  General Appearance: Well Groomed  Eye Contact:  Good  Speech:  Clear and Coherent and Normal Rate  Volume:  Normal  Mood:  Euthymic and Notes he has occasional depression but is able to cope with it.  Affect:  Congruent  Thought Process:  Coherent, Goal Directed and Linear  Orientation:  Full (Time, Place, and Person)  Thought Content: WDL and Logical   Suicidal Thoughts:  No  Homicidal Thoughts:  No  Memory:  Immediate;   Good Recent;   Good Remote;   Good  Judgement:  Good  Insight:  Good  Psychomotor Activity:  Normal  Concentration:  Concentration: Good and Attention Span: Good  Recall:  Good  Fund of Knowledge: Good  Language: Good  Akathisia:  No  Handed:  Right  AIMS (if indicated): Not  done  Assets:  Communication Skills Desire for Improvement Financial Resources/Insurance Housing Social Support  ADL's:  Intact  Cognition: WNL  Sleep:  Fair   Screenings: GAD-7   Flowsheet Row Clinical Support from 08/17/2020 in [facility] Clinical Support from 05/09/2020 in [facility] Video Visit from 02/12/2020 in [facility]  Total GAD-7 Score 10 13 17     PHQ2-9   Flowsheet Row Clinical Support from 08/17/2020 in [facility] Clinical Support from 05/09/2020 in [facility] Video Visit from 02/12/2020 in [facility]  PHQ-2 Total Score 3 3 4   PHQ-9 Total Score 5 10 11  Flowsheet Row Clinical Support from 05/09/2020 in [facility]  C-SSRS RISK CATEGORY No Risk       Assessment and Plan: Patient notes that his anxiety and depression has improved since his last visit.  He however endorses symptoms of ADHD.  Today he is agreeable to starting Strattera 40 mg to help manage symptoms of ADHD.  He will continue all other medication as prescribed.   1. Generalized anxiety disorder  Continue- busPIRone (BUSPAR) 15 MG tablet; Take 1 tablet (15 mg total) by mouth 3 (three) times daily.  Dispense: 90 tablet; Refill: 2 Continue- hydrOXYzine (ATARAX/VISTARIL) 10 MG tablet; Take 1 tablet (10 mg total) by mouth 3 (three) times daily as needed.  Dispense: 90 tablet; Refill: 2  2. Nightmare disorder  Continue- prazosin (MINIPRESS) 1 MG capsule; Take 1 capsule (1 mg total) by mouth at bedtime.  Dispense: 30 capsule; Refill: 2  3. Major depression, recurrent, chronic (HCC)  Continue- traZODone (DESYREL) 50 MG tablet; Take 1 tablet (50 mg total) by mouth at bedtime.  Dispense: 30 tablet; Refill: 2 Continue- vortioxetine HBr (TRINTELLIX) 10 MG TABS tablet; Take 1 tablet (10 mg total) by mouth daily.   Dispense: 30 tablet; Refill: 2  4. Attention deficit hyperactivity disorder (ADHD), predominantly inattentive type  Start- atomoxetine (STRATTERA) 40 MG capsule; Take 1 capsule (40 mg total) by mouth daily.  Dispense: 30 capsule; Refill: 2     Follow-up in 3 months  Shanna Cisco, NP 08/17/2020, 8:37 AM

## 2020-08-25 ENCOUNTER — Other Ambulatory Visit: Payer: Self-pay

## 2020-08-30 ENCOUNTER — Other Ambulatory Visit: Payer: Self-pay

## 2020-09-20 ENCOUNTER — Other Ambulatory Visit: Payer: Self-pay

## 2020-09-30 ENCOUNTER — Other Ambulatory Visit: Payer: Self-pay

## 2020-10-18 ENCOUNTER — Other Ambulatory Visit: Payer: Self-pay

## 2020-10-20 ENCOUNTER — Other Ambulatory Visit: Payer: Self-pay

## 2020-11-02 ENCOUNTER — Other Ambulatory Visit: Payer: Self-pay

## 2020-11-17 ENCOUNTER — Other Ambulatory Visit: Payer: Self-pay

## 2020-11-17 ENCOUNTER — Encounter (HOSPITAL_COMMUNITY): Payer: Self-pay | Admitting: Psychiatry

## 2020-11-17 ENCOUNTER — Ambulatory Visit (INDEPENDENT_AMBULATORY_CARE_PROVIDER_SITE_OTHER): Payer: No Payment, Other | Admitting: Psychiatry

## 2020-11-17 DIAGNOSIS — F900 Attention-deficit hyperactivity disorder, predominantly inattentive type: Secondary | ICD-10-CM | POA: Diagnosis not present

## 2020-11-17 DIAGNOSIS — F339 Major depressive disorder, recurrent, unspecified: Secondary | ICD-10-CM

## 2020-11-17 DIAGNOSIS — F411 Generalized anxiety disorder: Secondary | ICD-10-CM | POA: Diagnosis not present

## 2020-11-17 DIAGNOSIS — F515 Nightmare disorder: Secondary | ICD-10-CM

## 2020-11-17 MED ORDER — VORTIOXETINE HBR 10 MG PO TABS
10.0000 mg | ORAL_TABLET | Freq: Every day | ORAL | 3 refills | Status: DC
  Filled 2020-11-17: qty 30, 30d supply, fill #0

## 2020-11-17 MED ORDER — BUSPIRONE HCL 15 MG PO TABS
15.0000 mg | ORAL_TABLET | Freq: Three times a day (TID) | ORAL | 3 refills | Status: DC
  Filled 2020-11-17: qty 90, 30d supply, fill #0
  Filled 2021-01-11 – 2021-01-19 (×2): qty 90, 30d supply, fill #1

## 2020-11-17 MED ORDER — TRAZODONE HCL 50 MG PO TABS
50.0000 mg | ORAL_TABLET | Freq: Every day | ORAL | 3 refills | Status: DC
  Filled 2020-11-17: qty 30, 30d supply, fill #0

## 2020-11-17 MED ORDER — HYDROXYZINE HCL 10 MG PO TABS
10.0000 mg | ORAL_TABLET | Freq: Three times a day (TID) | ORAL | 3 refills | Status: DC | PRN
  Filled 2020-11-17: qty 90, 30d supply, fill #0

## 2020-11-17 MED ORDER — ATOMOXETINE HCL 40 MG PO CAPS
40.0000 mg | ORAL_CAPSULE | Freq: Every day | ORAL | 3 refills | Status: DC
  Filled 2020-11-17: qty 30, 30d supply, fill #0
  Filled 2020-12-16: qty 90, 90d supply, fill #1

## 2020-11-17 NOTE — Progress Notes (Signed)
BH MD/PA/NP OP Progress Note     11/17/2020 8:51 AM John Flowers  MRN:  [id number]  Chief Complaint: "Everything is just fine" Chief Complaint   Medication Management     HPI: 50 year old male seen today for follow up psychiatric evaluation.  He has a history of anxiety, depression, alcohol use disorder, and bipolar 2.  He is currently being managed: Prazosin 1 mg at bedtime, BuSpar 15 mg 3 times a day, Trintellix 10 mg daily,Strattera 40 mg daily, and trazodone 50 mg nightly.  He notes his medications are effective in managing his psychiatric conditions.  He informed Clinical research associate that he discontinued prazosin.  Today patient is well groomed, pleasant, cooperative, maintained eye contact, and engaged in conversation. He informed provider that since his last visit everything has been just fine.  He notes that Strattera has been effective in managing his concentration.  He notes that he continues to go to  [employer].  He informed Clinical research associate that this semester will be all virtual which he notes is concerning.  He informed Clinical research associate that he was going to go by the campus today to discuss other options.  Patient notes his anxiety and depression continues to be minimal.  Provider conducted a GAD-7 and patient scored a 5, at his last visit he scored a 10.  Provider also conducted a PHQ-9 and patient scored a 3, at his last visit he scored a 5.  He endorses adequate sleep and appetite.  Today he denies SI/HI/VAH, mania, or paranoia.    At this time patient does not want to restart prazosin.  No other medication changes made today.  He will continue all other medications as prescribed.  No other concerns noted at this time. Visit Diagnosis:    ICD-10-CM   1. Attention deficit hyperactivity disorder (ADHD), predominantly inattentive type  F90.0 atomoxetine (STRATTERA) 40 MG capsule    2. Generalized anxiety disorder  F41.1 busPIRone (BUSPAR) 15 MG tablet    hydrOXYzine (ATARAX/VISTARIL) 10 MG tablet    3. Nightmare  disorder  F51.5     4. Major depression, recurrent, chronic (HCC)  F33.9 traZODone (DESYREL) 50 MG tablet    vortioxetine HBr (TRINTELLIX) 10 MG TABS tablet      Past Psychiatric History: anxiety, depression, alcohol use disorder, and bipolar 2.   Past Medical History:  Past Medical History:  Diagnosis Date   Cirrhosis (HCC)    Depression    History reviewed. No pertinent surgical history.  Family Psychiatric History: Mother alcohol use disorder and sister depression   Family History: History reviewed. No pertinent family history.  Social History:  Social History   Socioeconomic History   Marital status: Single    Spouse name: Not on file   Number of children: Not on file   Years of education: Not on file   Highest education level: Not on file  Occupational History   Not on file  Tobacco Use   Smoking status: Every Day    Packs/day: 1.00    Types: Cigarettes   Smokeless tobacco: Never  Substance and Sexual Activity   Alcohol use: No    Comment: pt reports sober x5 years, hx of alcoholism   Drug use: Yes    Types: Marijuana   Sexual activity: Not on file  Other Topics Concern   Not on file  Social History Narrative   Not on file   Social Determinants of Health   Financial Resource Strain: Not on file  Food Insecurity: Not on file  Transportation Needs: Not on file  Physical Activity: Not on file  Stress: Not on file  Social Connections: Not on file    Allergies: No Known Allergies  Metabolic Disorder Labs: No results found for: HGBA1C, MPG No results found for: PROLACTIN No results found for: CHOL, TRIG, HDL, CHOLHDL, VLDL, LDLCALC Lab Results  Component Value Date   TSH 0.376 11/17/2016   TSH 3.343 05/04/2009    Therapeutic Level Labs: No results found for: LITHIUM No results found for: VALPROATE No components found for:  CBMZ  Current Medications: Current Outpatient Medications  Medication Sig Dispense Refill   atomoxetine (STRATTERA) 40  MG capsule Take 1 capsule (40 mg total) by mouth daily. 30 capsule 3   busPIRone (BUSPAR) 15 MG tablet Take 1 tablet (15 mg total) by mouth 3 (three) times daily. 90 tablet 3   hydrOXYzine (ATARAX/VISTARIL) 10 MG tablet Take 1 tablet (10 mg total) by mouth 3 (three) times daily as needed. 90 tablet 3   traZODone (DESYREL) 50 MG tablet Take 1 tablet (50 mg total) by mouth at bedtime. 30 tablet 3   vortioxetine HBr (TRINTELLIX) 10 MG TABS tablet Take 1 tablet (10 mg total) by mouth daily. 30 tablet 3   No current facility-administered medications for this visit.     Musculoskeletal: Strength & Muscle Tone: within normal limits Gait & Station: normal Patient leans: N/A  Psychiatric Specialty Exam: Review of Systems  Blood pressure 105/75, pulse 75, height 6\' 1"  (1.854 m), weight 193 lb (87.5 kg).Body mass index is 25.46 kg/m.  General Appearance: Well Groomed  Eye Contact:  Good  Speech:  Clear and Coherent and Normal Rate  Volume:  Normal  Mood:  Euthymic  Affect:  Congruent  Thought Process:  Coherent, Goal Directed and Linear  Orientation:  Full (Time, Place, and Person)  Thought Content: WDL and Logical   Suicidal Thoughts:  No  Homicidal Thoughts:  No  Memory:  Immediate;   Good Recent;   Good Remote;   Good  Judgement:  Good  Insight:  Good  Psychomotor Activity:  Normal  Concentration:  Concentration: Good and Attention Span: Good  Recall:  Good  Fund of Knowledge: Good  Language: Good  Akathisia:  No  Handed:  Right  AIMS (if indicated): Not done  Assets:  Communication Skills Desire for Improvement Financial Resources/Insurance Housing Social Support  ADL's:  Intact  Cognition: WNL  Sleep:  Good   Screenings: GAD-7    Flowsheet Row Clinical Support from 11/17/2020 in [facility] Clinical Support from 08/17/2020 in [facility] Clinical Support from 05/09/2020 in [facility]  Video Visit from 02/12/2020 in [facility]  Total GAD-7 Score 5 10 13 17       PHQ2-9    Flowsheet Row Clinical Support from 11/17/2020 in [facility] Clinical Support from 08/17/2020 in [facility] Clinical Support from 05/09/2020 in [facility] Video Visit from 02/12/2020 in [facility]  PHQ-2 Total Score 0 3 3 4   PHQ-9 Total Score 3 5 10 11       Flowsheet Row Clinical Support from 05/09/2020 in [facility]  C-SSRS RISK CATEGORY No Risk        Assessment and Plan: Patient notes that he is doing well on his current medication regimen.  At this time patient does not want to restart prazosin.  No other medication changes made today.  He will continue all other medications as prescribed.    1. Generalized anxiety disorder  Continue- busPIRone (BUSPAR) 15 MG tablet; Take 1 tablet (15 mg total) by mouth 3 (three) times daily.  Dispense: 90 tablet; Refill: 2 Continue- hydrOXYzine (ATARAX/VISTARIL) 10 MG tablet; Take 1 tablet (10 mg total) by mouth 3 (three) times daily as needed.  Dispense: 90 tablet; Refill: 2  2.  Major depression, recurrent, chronic (HCC)  Continue- traZODone (DESYREL) 50 MG tablet; Take 1 tablet (50 mg total) by mouth at bedtime.  Dispense: 30 tablet; Refill: 2 Continue- vortioxetine HBr (TRINTELLIX) 10 MG TABS tablet; Take 1 tablet (10 mg total) by mouth daily.  Dispense: 30 tablet; Refill: 2  3. Attention deficit hyperactivity disorder (ADHD), predominantly inattentive type  Start- atomoxetine (STRATTERA) 40 MG capsule; Take 1 capsule (40 mg total) by mouth daily.  Dispense: 30 capsule; Refill: 2     Follow-up in 3 months  Shanna Cisco, NP 11/17/2020, 8:51 AM

## 2020-11-21 ENCOUNTER — Other Ambulatory Visit: Payer: Self-pay

## 2020-11-22 ENCOUNTER — Other Ambulatory Visit: Payer: Self-pay

## 2020-12-16 ENCOUNTER — Other Ambulatory Visit: Payer: Self-pay

## 2020-12-26 ENCOUNTER — Telehealth (HOSPITAL_COMMUNITY): Payer: Self-pay | Admitting: *Deleted

## 2020-12-26 NOTE — Telephone Encounter (Signed)
Thank you for discussing walk-in hours with patient.  He can come to the clinic on Mondays and Tuesdays between the hours of 8 and 11.

## 2020-12-26 NOTE — Telephone Encounter (Signed)
Patient called John Flowers  stated he need a sooner appointment. Called Patient to get more details  & to share about walk-in schedule. Patient stated his primary issue is depression.

## 2021-01-11 ENCOUNTER — Other Ambulatory Visit: Payer: Self-pay

## 2021-01-18 ENCOUNTER — Other Ambulatory Visit: Payer: Self-pay

## 2021-01-19 ENCOUNTER — Other Ambulatory Visit: Payer: Self-pay

## 2021-01-26 ENCOUNTER — Other Ambulatory Visit: Payer: Self-pay

## 2021-02-09 ENCOUNTER — Other Ambulatory Visit: Payer: Self-pay

## 2021-02-15 ENCOUNTER — Other Ambulatory Visit: Payer: Self-pay

## 2021-02-22 ENCOUNTER — Ambulatory Visit (INDEPENDENT_AMBULATORY_CARE_PROVIDER_SITE_OTHER): Payer: No Payment, Other | Admitting: Psychiatry

## 2021-02-22 ENCOUNTER — Other Ambulatory Visit: Payer: Self-pay

## 2021-02-22 ENCOUNTER — Encounter (HOSPITAL_COMMUNITY): Payer: Self-pay | Admitting: Psychiatry

## 2021-02-22 DIAGNOSIS — F339 Major depressive disorder, recurrent, unspecified: Secondary | ICD-10-CM

## 2021-02-22 DIAGNOSIS — F900 Attention-deficit hyperactivity disorder, predominantly inattentive type: Secondary | ICD-10-CM

## 2021-02-22 MED ORDER — VORTIOXETINE HBR 10 MG PO TABS
10.0000 mg | ORAL_TABLET | Freq: Every day | ORAL | 3 refills | Status: DC
  Filled 2021-02-22: qty 30, 30d supply, fill #0
  Filled 2021-04-06: qty 90, 90d supply, fill #0

## 2021-02-22 MED ORDER — ATOMOXETINE HCL 40 MG PO CAPS
40.0000 mg | ORAL_CAPSULE | Freq: Every day | ORAL | 3 refills | Status: DC
  Filled 2021-02-22 – 2021-04-06 (×2): qty 30, 30d supply, fill #0
  Filled 2021-05-09: qty 30, 30d supply, fill #1

## 2021-02-22 NOTE — Progress Notes (Signed)
BH MD/PA/NP OP Progress Note     02/22/2021 10:55 AM John Flowers  MRN:  [id number]  Chief Complaint: "Overall I am pretty good" Chief Complaint   Medication Management     HPI: 50 year old male seen today for follow up psychiatric evaluation.  He has a history of anxiety, depression, alcohol use disorder, and bipolar 2.  He is currently being managed: Prazosin 1 mg at bedtime, BuSpar 15 mg 3 times a day, Trintellix 10 mg daily,Strattera 40 mg daily, and trazodone 50 mg nightly.  He notes that he discontinued  BuSpar and trazodone.  He informed Clinical research associate that [employer] are effective in managing his psychiatric conditions his medications are effective in managing his psychiatric conditions.    Today patient is well groomed, pleasant, cooperative, maintained eye contact, and engaged in conversation. He informed provider that since his last visit he has been doing pretty well.  He notes that he did withdrawal from a couple classes at [facility] as they were triggering increased depression.  He notes that he was at a mediocre stage in life and did not want to slip back into alcohol use.  He notes that since discontinuing those classes he is feeling better.  He reports that Trintellix and Strattera has been effective in managing his psychiatric conditions.  Provider conducted a GAD-7 and patient scored a 7, at his last visit he scored a 5.  Provider also conducted a PHQ-9 and patient scored a 5, at his last visit he scored a 3.  He notes that he sleeps approximately 4 to 5 hours nightly and notes that his appetite fluctuates.  He denies recent weight loss/gain.  Today he denies SI/HI/VAH, mania, or paranoia.   At this time patient does not want to restart BuSpar or trazodone.  He will continue all other medications as prescribed.  No other concerns noted at this time. Visit Diagnosis:    ICD-10-CM   1. Major depression, recurrent, chronic (HCC)  F33.9 vortioxetine HBr (TRINTELLIX) 10 MG  TABS tablet    2. Attention deficit hyperactivity disorder (ADHD), predominantly inattentive type  F90.0 atomoxetine (STRATTERA) 40 MG capsule      Past Psychiatric History: anxiety, depression, alcohol use disorder, and bipolar 2.   Past Medical History:  Past Medical History:  Diagnosis Date   Cirrhosis (HCC)    Depression    No past surgical history on file.  Family Psychiatric History: Mother alcohol use disorder and sister depression   Family History: No family history on file.  Social History:  Social History   Socioeconomic History   Marital status: Single    Spouse name: Not on file   Number of children: Not on file   Years of education: Not on file   Highest education level: Not on file  Occupational History   Not on file  Tobacco Use   Smoking status: Every Day    Packs/day: 1.00    Types: Cigarettes   Smokeless tobacco: Never  Substance and Sexual Activity   Alcohol use: No    Comment: pt reports sober x5 years, hx of alcoholism   Drug use: Yes    Types: Marijuana   Sexual activity: Not on file  Other Topics Concern   Not on file  Social History Narrative   Not on file   Social Determinants of Health   Financial Resource Strain: Not on file  Food Insecurity: Not on file  Transportation Needs: Not on file  Physical Activity: Not on file  Stress: Not on file  Social Connections: Not on file    Allergies: No Known Allergies  Metabolic Disorder Labs: No results found for: HGBA1C, MPG No results found for: PROLACTIN No results found for: CHOL, TRIG, HDL, CHOLHDL, VLDL, LDLCALC Lab Results  Component Value Date   TSH 0.376 11/17/2016   TSH 3.343 05/04/2009    Therapeutic Level Labs: No results found for: LITHIUM No results found for: VALPROATE No components found for:  CBMZ  Current Medications: Current Outpatient Medications  Medication Sig Dispense Refill   atomoxetine (STRATTERA) 40 MG capsule Take 1 capsule (40 mg total) by mouth  daily. 30 capsule 3   vortioxetine HBr (TRINTELLIX) 10 MG TABS tablet Take 1 tablet (10 mg total) by mouth daily. 30 tablet 3   No current facility-administered medications for this visit.     Musculoskeletal: Strength & Muscle Tone: within normal limits Gait & Station: normal Patient leans: N/A  Psychiatric Specialty Exam: Review of Systems  Blood pressure 124/88, pulse 84, height 6\' 1"  (1.854 m), weight 192 lb (87.1 kg), SpO2 99 %.Body mass index is 25.33 kg/m.  General Appearance: Well Groomed  Eye Contact:  Good  Speech:  Clear and Coherent and Normal Rate  Volume:  Normal  Mood:  Euthymic  Affect:  Congruent  Thought Process:  Coherent, Goal Directed and Linear  Orientation:  Full (Time, Place, and Person)  Thought Content: WDL and Logical   Suicidal Thoughts:  No  Homicidal Thoughts:  No  Memory:  Immediate;   Good Recent;   Good Remote;   Good  Judgement:  Good  Insight:  Good  Psychomotor Activity:  Normal  Concentration:  Concentration: Good and Attention Span: Good  Recall:  Good  Fund of Knowledge: Good  Language: Good  Akathisia:  No  Handed:  Right  AIMS (if indicated): Not done  Assets:  Communication Skills Desire for Improvement Financial Resources/Insurance Housing Social Support  ADL's:  Intact  Cognition: WNL  Sleep:  Good   Screenings: GAD-7    Flowsheet Row Clinical Support from 02/22/2021 in [facility] Clinical Support from 11/17/2020 in [facility] Clinical Support from 08/17/2020 in [facility] Clinical Support from 05/09/2020 in [facility] Video Visit from 02/12/2020 in [facility]  Total GAD-7 Score 7 5 10 13 17       PHQ2-9    Flowsheet Row Clinical Support from 02/22/2021 in [facility] Clinical Support from 11/17/2020 in [facility]  Clinical Support from 08/17/2020 in [facility] Clinical Support from 05/09/2020 in [facility] Video Visit from 02/12/2020 in [facility]  PHQ-2 Total Score 2 0 3 3 4   PHQ-9 Total Score 5 3 5 10 11       Flowsheet Row Clinical Support from 05/09/2020 in [facility]  C-SSRS RISK CATEGORY No Risk        Assessment and Plan: Patient notes that he discontinued BuSpar and trazodone. At this time patient does not want to restart those medication.  He will continue all other medications as prescribed  1. Major depression, recurrent, chronic (HCC)  Continue- vortioxetine HBr (TRINTELLIX) 10 MG TABS tablet; Take 1 tablet (10 mg total) by mouth daily.  Dispense: 30 tablet; Refill: 3  2. Attention deficit hyperactivity disorder (ADHD), predominantly inattentive type  Continue- atomoxetine (STRATTERA) 40 MG capsule;  Take 1 capsule (40 mg total) by mouth daily.  Dispense: 30 capsule; Refill: 3     Follow-up in 3 months  Shanna Cisco, NP 02/22/2021, 10:55 AM

## 2021-03-02 ENCOUNTER — Other Ambulatory Visit: Payer: Self-pay

## 2021-04-06 ENCOUNTER — Other Ambulatory Visit: Payer: Self-pay

## 2021-04-17 ENCOUNTER — Other Ambulatory Visit: Payer: Self-pay

## 2021-05-09 ENCOUNTER — Other Ambulatory Visit: Payer: Self-pay

## 2021-05-10 ENCOUNTER — Other Ambulatory Visit: Payer: Self-pay

## 2021-05-17 ENCOUNTER — Ambulatory Visit (INDEPENDENT_AMBULATORY_CARE_PROVIDER_SITE_OTHER): Payer: No Payment, Other | Admitting: Psychiatry

## 2021-05-17 ENCOUNTER — Other Ambulatory Visit: Payer: Self-pay

## 2021-05-17 ENCOUNTER — Encounter (HOSPITAL_COMMUNITY): Payer: Self-pay | Admitting: Psychiatry

## 2021-05-17 DIAGNOSIS — F339 Major depressive disorder, recurrent, unspecified: Secondary | ICD-10-CM

## 2021-05-17 DIAGNOSIS — F900 Attention-deficit hyperactivity disorder, predominantly inattentive type: Secondary | ICD-10-CM | POA: Diagnosis not present

## 2021-05-17 MED ORDER — VORTIOXETINE HBR 10 MG PO TABS
10.0000 mg | ORAL_TABLET | Freq: Every day | ORAL | 3 refills | Status: DC
  Filled 2021-05-17: qty 30, 30d supply, fill #0

## 2021-05-17 MED ORDER — ATOMOXETINE HCL 40 MG PO CAPS
40.0000 mg | ORAL_CAPSULE | Freq: Every day | ORAL | 3 refills | Status: AC
Start: 2021-05-17 — End: ?
  Filled 2021-05-17 – 2021-06-08 (×2): qty 30, 30d supply, fill #0
  Filled 2021-06-16: qty 90, 90d supply, fill #0

## 2021-05-17 MED ORDER — VORTIOXETINE HBR 20 MG PO TABS
20.0000 mg | ORAL_TABLET | Freq: Every day | ORAL | 3 refills | Status: AC
  Filled 2021-05-17 – 2021-06-16 (×2): qty 30, 30d supply, fill #0
  Filled 2021-07-19 – 2021-08-02 (×2): qty 30, 30d supply, fill #1

## 2021-05-17 NOTE — Progress Notes (Signed)
BH MD/PA/NP OP Progress Note     05/17/2021 10:28 AM SHANDELL JALLOW  MRN:  [id number]  Chief Complaint: "I've been in a gray area that concerns me" Chief Complaint   Medication Management     HPI: 51 year old male seen today for follow up psychiatric evaluation.  He has a history of anxiety, depression, alcohol use disorder, and bipolar 2.  He is currently being managed on Trintellix 10 mg daily and Strattera 40 mg daily.   He informed Clinical research associate that [employer] are effective in managing his psychiatric conditions his medications are effective in managing his psychiatric conditions.    Today patient is well groomed, pleasant, cooperative, maintained eye contact, and engaged in conversation. He informed provider that since his last visit he has been in a gray area which he notes concerns none.  He informed Clinical research associate that in this area he lacks motivation however is able to get things done.  At times he notes that he does not find enjoyment in activities he once enjoyed.  He notes that this is a struggle for him to get up at times.  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  Today provider conducted a GAD-7 and patient scored a 6, at his last visit he scored a 7.  Provider also conducted PHQ-9 he scored a 6, at his last visit he scored a 5.  He endorses adequate sleep and appetite.  Today he denies SI/HI/VAH, mania, paranoia.    Patient reports ports that he has been maintaining his sobriety for years.  He does note that he continue to go to [employer] regularly.  Today patient agreeable to increasing Trintellix 10 mg to 20 mg to help manage anxiety and depression.  He will continue Strattera as prescribed.  No other concerns noted at this time. Visit Diagnosis:    ICD-10-CM   1. Attention deficit hyperactivity disorder (ADHD), predominantly inattentive type  F90.0 atomoxetine (STRATTERA) 40 MG capsule    2. Major depression, recurrent,  chronic (HCC)  F33.9 vortioxetine HBr (TRINTELLIX) 20 MG TABS tablet    DISCONTINUED: vortioxetine HBr (TRINTELLIX) 10 MG TABS tablet      Past Psychiatric History: anxiety, depression, alcohol use disorder, and bipolar 2.   Past Medical History:  Past Medical History:  Diagnosis Date   Cirrhosis (HCC)    Depression    No past surgical history on file.  Family Psychiatric History: Mother alcohol use disorder and sister depression   Family History: No family history on file.  Social History:  Social History   Socioeconomic History   Marital status: Single    Spouse name: Not on file   Number of children: Not on file   Years of education: Not on file   Highest education level: Not on file  Occupational History   Not on file  Tobacco Use   Smoking status: Every Day    Packs/day: 1.00    Types: Cigarettes   Smokeless tobacco: Never  Substance and Sexual Activity   Alcohol use: No    Comment: pt reports sober x5 years, hx of alcoholism   Drug use: Yes    Types: Marijuana   Sexual activity: Not on file  Other Topics Concern   Not on file  Social History Narrative   Not on file   Social Determinants of Health   Financial Resource Strain: Not on file  Food Insecurity: Not on file  Transportation Needs: Not  on file  Physical Activity: Not on file  Stress: Not on file  Social Connections: Not on file    Allergies: No Known Allergies  Metabolic Disorder Labs: No results found for: HGBA1C, MPG No results found for: PROLACTIN No results found for: CHOL, TRIG, HDL, CHOLHDL, VLDL, LDLCALC Lab Results  Component Value Date   TSH 0.376 11/17/2016   TSH 3.343 05/04/2009    Therapeutic Level Labs: No results found for: LITHIUM No results found for: VALPROATE No components found for:  CBMZ  Current Medications: Current Outpatient Medications  Medication Sig Dispense Refill   atomoxetine (STRATTERA) 40 MG capsule Take 1 capsule (40 mg total) by mouth daily. 30  capsule 3   vortioxetine HBr (TRINTELLIX) 20 MG TABS tablet Take 1 tablet (20 mg total) by mouth daily. 30 tablet 3   No current facility-administered medications for this visit.     Musculoskeletal: Strength & Muscle Tone: within normal limits Gait & Station: normal Patient leans: N/A  Psychiatric Specialty Exam: Review of Systems  Blood pressure 132/86, pulse 68, height 6\' 2"  (1.88 m), weight 197 lb (89.4 kg).Body mass index is 25.29 kg/m.  General Appearance: Well Groomed  Eye Contact:  Good  Speech:  Clear and Coherent and Normal Rate  Volume:  Normal  Mood:  Anxious and Depressed, describes anxiety and depression as a gray  Affect:  Congruent  Thought Process:  Coherent, Goal Directed and Linear  Orientation:  Full (Time, Place, and Person)  Thought Content: WDL and Logical   Suicidal Thoughts:  No  Homicidal Thoughts:  No  Memory:  Immediate;   Good Recent;   Good Remote;   Good  Judgement:  Good  Insight:  Good  Psychomotor Activity:  Normal  Concentration:  Concentration: Good and Attention Span: Good  Recall:  Good  Fund of Knowledge: Good  Language: Good  Akathisia:  No  Handed:  Right  AIMS (if indicated): Not done  Assets:  Communication Skills Desire for Improvement Financial Resources/Insurance Housing Social Support  ADL's:  Intact  Cognition: WNL  Sleep:  Good   Screenings: GAD-7    Flowsheet Row Clinical Support from 05/17/2021 in [facility] Clinical Support from 02/22/2021 in [facility] Clinical Support from 11/17/2020 in [facility] Clinical Support from 08/17/2020 in [facility] Clinical Support from 05/09/2020 in [facility]  Total GAD-7 Score 6 7 5 10 13       PHQ2-9    Flowsheet Row Clinical Support from 05/17/2021 in [facility] Clinical Support from 02/22/2021 in  [facility] Clinical Support from 11/17/2020 in [facility] Clinical Support from 08/17/2020 in [facility] Clinical Support from 05/09/2020 in [facility]  PHQ-2 Total Score 2 2 0 3 3  PHQ-9 Total Score 6 5 3 5 10       Flowsheet Row Clinical Support from 05/09/2020 in [facility]  C-SSRS RISK CATEGORY No Risk        Assessment and Plan: Patient that he has been in a gray area that is concerning.  Since his last visit he notes that he feels somewhat anxious and depressed.  Trintellix increased from 10 mg to 20 mg to help manage symptoms of anxiety and depression.  He will continue Strattera as prescribed  1. Major depression, recurrent, chronic (HCC)  Increased- vortioxetine HBr (  TRINTELLIX) 20 MG TABS tablet; Take 1 tablet (20 mg total) by mouth daily.  Dispense: 30 tablet; Refill: 3  2. Attention deficit hyperactivity disorder (ADHD), predominantly inattentive type  Continue- atomoxetine (STRATTERA) 40 MG capsule; Take 1 capsule (40 mg total) by mouth daily.  Dispense: 30 capsule; Refill: 3     Follow-up in 3 months  Shanna Cisco, NP 05/17/2021, 10:28 AM

## 2021-06-08 ENCOUNTER — Other Ambulatory Visit: Payer: Self-pay

## 2021-06-16 ENCOUNTER — Other Ambulatory Visit: Payer: Self-pay

## 2021-07-19 ENCOUNTER — Other Ambulatory Visit: Payer: Self-pay

## 2021-07-31 ENCOUNTER — Other Ambulatory Visit: Payer: Self-pay

## 2021-08-01 ENCOUNTER — Encounter (HOSPITAL_COMMUNITY): Payer: No Payment, Other | Admitting: Psychiatry

## 2021-08-02 ENCOUNTER — Encounter (HOSPITAL_COMMUNITY): Payer: Self-pay

## 2021-08-02 ENCOUNTER — Other Ambulatory Visit: Payer: Self-pay

## 2021-08-02 ENCOUNTER — Telehealth (HOSPITAL_COMMUNITY): Payer: No Payment, Other | Admitting: Psychiatry

## 2021-10-02 ENCOUNTER — Other Ambulatory Visit: Payer: Self-pay

## 2021-10-09 ENCOUNTER — Other Ambulatory Visit: Payer: Self-pay

## 2021-10-10 ENCOUNTER — Other Ambulatory Visit: Payer: Self-pay

## 2021-10-13 ENCOUNTER — Other Ambulatory Visit: Payer: Self-pay

## 2021-11-08 ENCOUNTER — Other Ambulatory Visit: Payer: Self-pay

## 2022-01-03 ENCOUNTER — Other Ambulatory Visit: Payer: Self-pay

## 2022-09-10 ENCOUNTER — Other Ambulatory Visit: Payer: Self-pay
